# Patient Record
Sex: Male | Born: 1945 | Race: White | Hispanic: No | Marital: Married | Smoking: Never smoker
Health system: Southern US, Community
[De-identification: ages and names within clinical notes are randomized; demographics above are authoritative.]

## PROBLEM LIST (undated history)

## (undated) DIAGNOSIS — C61 Malignant neoplasm of prostate: Secondary | ICD-10-CM

## (undated) DIAGNOSIS — I10 Essential (primary) hypertension: Secondary | ICD-10-CM

## (undated) DIAGNOSIS — K219 Gastro-esophageal reflux disease without esophagitis: Secondary | ICD-10-CM

## (undated) DIAGNOSIS — M1990 Unspecified osteoarthritis, unspecified site: Secondary | ICD-10-CM

## (undated) DIAGNOSIS — M1711 Unilateral primary osteoarthritis, right knee: Secondary | ICD-10-CM

## (undated) DIAGNOSIS — I440 Atrioventricular block, first degree: Secondary | ICD-10-CM

## (undated) DIAGNOSIS — I4510 Unspecified right bundle-branch block: Secondary | ICD-10-CM

## (undated) DIAGNOSIS — K759 Inflammatory liver disease, unspecified: Secondary | ICD-10-CM

## (undated) HISTORY — DX: Essential (primary) hypertension: I10

## (undated) HISTORY — PX: COLONOSCOPY: SHX174

## (undated) HISTORY — PX: CERVICAL DISC SURGERY: SHX588

---

## 1973-09-09 DIAGNOSIS — K759 Inflammatory liver disease, unspecified: Secondary | ICD-10-CM

## 1973-09-09 HISTORY — DX: Inflammatory liver disease, unspecified: K75.9

## 2003-11-30 ENCOUNTER — Encounter: Payer: Self-pay | Admitting: Internal Medicine

## 2005-09-12 ENCOUNTER — Ambulatory Visit: Payer: Self-pay | Admitting: Internal Medicine

## 2005-09-19 ENCOUNTER — Ambulatory Visit: Payer: Self-pay | Admitting: Internal Medicine

## 2005-09-20 ENCOUNTER — Encounter: Payer: Self-pay | Admitting: Internal Medicine

## 2005-11-07 ENCOUNTER — Ambulatory Visit: Payer: Self-pay | Admitting: Internal Medicine

## 2005-11-27 ENCOUNTER — Ambulatory Visit: Payer: Self-pay | Admitting: Internal Medicine

## 2006-02-04 ENCOUNTER — Ambulatory Visit: Payer: Self-pay | Admitting: Internal Medicine

## 2006-09-19 ENCOUNTER — Ambulatory Visit: Payer: Self-pay | Admitting: Internal Medicine

## 2006-09-19 LAB — CONVERTED CEMR LAB
ALT: 28 units/L (ref 0–40)
AST: 27 units/L (ref 0–37)
Albumin: 3.8 g/dL (ref 3.5–5.2)
Alkaline Phosphatase: 55 units/L (ref 39–117)
BUN: 15 mg/dL (ref 6–23)
Basophils Absolute: 0 10*3/uL (ref 0.0–0.1)
Basophils Relative: 0.7 % (ref 0.0–1.0)
CO2: 28 meq/L (ref 19–32)
Calcium: 9.3 mg/dL (ref 8.4–10.5)
Chloride: 108 meq/L (ref 96–112)
Chol/HDL Ratio, serum: 4.9
Cholesterol: 211 mg/dL (ref 0–200)
Creatinine, Ser: 0.9 mg/dL (ref 0.4–1.5)
Direct LDL: 140.5 mg/dL
Eosinophil percent: 6.3 % — ABNORMAL HIGH (ref 0.0–5.0)
Eosinophils Relative: 6.3 % — ABNORMAL HIGH (ref 0.0–5.0)
GFR calc Af Amer: 111 mL/min
GFR calc non Af Amer: 91 mL/min
Glomerular Filtration Rate, Af Am: 111 mL/min/{1.73_m2}
Glucose, Bld: 110 mg/dL — ABNORMAL HIGH (ref 70–99)
Glucose, Bld: 110 mg/dL — ABNORMAL HIGH (ref 70–99)
HCT: 40.8 % (ref 39.0–52.0)
HDL: 43.4 mg/dL (ref >39.0)
Hemoglobin: 13.7 g/dL (ref 13.0–17.0)
Hgb A1c MFr Bld: 5.7 % (ref 4.6–6.0)
LDL DIRECT: 140.5 mg/dL
Lymphocytes Relative: 32.1 % (ref 12.0–46.0)
MCHC: 33.5 g/dL (ref 30.0–36.0)
MCV: 92.3 fL (ref 78.0–100.0)
Monocytes Absolute: 0.5 10*3/uL (ref 0.2–0.7)
Monocytes Relative: 9.2 % (ref 3.0–11.0)
Neutro Abs: 3 10*3/uL (ref 1.4–7.7)
Neutrophils Relative %: 51.7 % (ref 43.0–77.0)
PSA: 1.36 ng/mL (ref 0.10–4.00)
Platelets: 239 10*3/uL (ref 150–400)
Potassium: 4.5 meq/L (ref 3.5–5.1)
RBC: 4.42 M/uL (ref 4.22–5.81)
RDW: 12.9 % (ref 11.5–14.6)
Sodium: 141 meq/L (ref 135–145)
TSH: 2.78 microintl units/mL (ref 0.35–5.50)
Total Bilirubin: 0.9 mg/dL (ref 0.3–1.2)
Total CHOL/HDL Ratio: 4.9
Total Protein: 6.5 g/dL (ref 6.0–8.3)
Triglyceride fasting, serum: 155 mg/dL — ABNORMAL HIGH (ref 0–149)
Triglycerides: 155 mg/dL — ABNORMAL HIGH (ref 0–149)
VLDL: 31 mg/dL (ref 0–40)
WBC: 5.7 10*3/uL (ref 4.5–10.5)

## 2006-10-08 ENCOUNTER — Encounter: Payer: Self-pay | Admitting: Internal Medicine

## 2006-10-08 ENCOUNTER — Ambulatory Visit: Payer: Self-pay | Admitting: Internal Medicine

## 2006-10-08 DIAGNOSIS — E785 Hyperlipidemia, unspecified: Secondary | ICD-10-CM

## 2006-10-08 DIAGNOSIS — I10 Essential (primary) hypertension: Secondary | ICD-10-CM

## 2006-10-08 LAB — CONVERTED CEMR LAB
Cholesterol, target level: 200 mg/dL
HDL goal, serum: 40 mg/dL
LDL Goal: 130 mg/dL

## 2007-04-29 ENCOUNTER — Encounter: Payer: Self-pay | Admitting: Internal Medicine

## 2007-11-18 ENCOUNTER — Ambulatory Visit: Payer: Self-pay | Admitting: Internal Medicine

## 2007-11-18 DIAGNOSIS — I4510 Unspecified right bundle-branch block: Secondary | ICD-10-CM

## 2007-12-04 ENCOUNTER — Ambulatory Visit: Payer: Self-pay | Admitting: Internal Medicine

## 2007-12-04 LAB — CONVERTED CEMR LAB
ALT: 28 units/L (ref 0–53)
Alkaline Phosphatase: 62 units/L (ref 39–117)
Basophils Absolute: 0 10*3/uL (ref 0.0–0.1)
Bilirubin Urine: NEGATIVE
Bilirubin, Direct: 0.1 mg/dL (ref 0.0–0.3)
CO2: 28 meq/L (ref 19–32)
Calcium: 9.9 mg/dL (ref 8.4–10.5)
Cholesterol: 204 mg/dL (ref 0–200)
GFR calc Af Amer: 147 mL/min
Glucose, Bld: 103 mg/dL — ABNORMAL HIGH (ref 70–99)
Glucose, Urine, Semiquant: NEGATIVE
HDL: 44.2 mg/dL (ref >39.0)
Hemoglobin: 13.3 g/dL (ref 13.0–17.0)
Ketones, urine, test strip: NEGATIVE
Lymphocytes Relative: 29.6 % (ref 12.0–46.0)
MCHC: 33.2 g/dL (ref 30.0–36.0)
Monocytes Relative: 11.7 % (ref 3.0–12.0)
Neutro Abs: 3 10*3/uL (ref 1.4–7.7)
Nitrite: NEGATIVE
PSA: 1.8 ng/mL (ref 0.10–4.00)
Platelets: 216 10*3/uL (ref 150–400)
Potassium: 4.8 meq/L (ref 3.5–5.1)
RDW: 12.6 % (ref 11.5–14.6)
Sodium: 139 meq/L (ref 135–145)
Specific Gravity, Urine: >=1.03
TSH: 1.37 microintl units/mL (ref 0.35–5.50)
Total Bilirubin: 0.6 mg/dL (ref 0.3–1.2)
Total CHOL/HDL Ratio: 4.6
Total Protein: 6.5 g/dL (ref 6.0–8.3)
Triglycerides: 111 mg/dL (ref 0–149)
Urobilinogen, UA: 0.2
VLDL: 22 mg/dL (ref 0–40)
pH: 6

## 2007-12-11 ENCOUNTER — Ambulatory Visit: Payer: Self-pay | Admitting: Internal Medicine

## 2008-01-06 ENCOUNTER — Ambulatory Visit: Payer: Self-pay | Admitting: Gastroenterology

## 2008-01-27 ENCOUNTER — Ambulatory Visit: Payer: Self-pay | Admitting: Gastroenterology

## 2008-01-27 ENCOUNTER — Encounter: Payer: Self-pay | Admitting: Gastroenterology

## 2008-02-03 ENCOUNTER — Encounter: Payer: Self-pay | Admitting: Gastroenterology

## 2008-02-04 ENCOUNTER — Telehealth (INDEPENDENT_AMBULATORY_CARE_PROVIDER_SITE_OTHER): Payer: Self-pay | Admitting: *Deleted

## 2009-04-12 ENCOUNTER — Encounter: Admission: RE | Admit: 2009-04-12 | Discharge: 2009-04-12 | Payer: Self-pay | Admitting: Orthopaedic Surgery

## 2009-10-13 ENCOUNTER — Ambulatory Visit: Payer: Self-pay | Admitting: Internal Medicine

## 2009-10-13 LAB — CONVERTED CEMR LAB
ALT: 31 units/L (ref 0–53)
Albumin: 3.9 g/dL (ref 3.5–5.2)
Basophils Relative: 0.8 % (ref 0.0–3.0)
CO2: 28 meq/L (ref 19–32)
Chloride: 106 meq/L (ref 96–112)
Cholesterol: 201 mg/dL — ABNORMAL HIGH (ref 0–200)
Direct LDL: 131.9 mg/dL
Eosinophils Absolute: 0.2 10*3/uL (ref 0.0–0.7)
Eosinophils Relative: 3.7 % (ref 0.0–5.0)
HCT: 41.7 % (ref 39.0–52.0)
Ketones, ur: NEGATIVE mg/dL
Leukocytes, UA: NEGATIVE
Lymphs Abs: 1.7 10*3/uL (ref 0.7–4.0)
MCHC: 33.3 g/dL (ref 30.0–36.0)
MCV: 93.5 fL (ref 78.0–100.0)
Monocytes Absolute: 0.5 10*3/uL (ref 0.1–1.0)
Neutro Abs: 2.8 10*3/uL (ref 1.4–7.7)
Potassium: 4.2 meq/L (ref 3.5–5.1)
RBC: 4.46 M/uL (ref 4.22–5.81)
Specific Gravity, Urine: 1.015 (ref 1.000–1.030)
TSH: 1.96 microintl units/mL (ref 0.35–5.50)
Total CHOL/HDL Ratio: 5
Total Protein, Urine: NEGATIVE mg/dL
Total Protein: 6.7 g/dL (ref 6.0–8.3)
Urine Glucose: NEGATIVE mg/dL
WBC: 5.2 10*3/uL (ref 4.5–10.5)
pH: 6.5 (ref 5.0–8.0)

## 2009-10-26 ENCOUNTER — Ambulatory Visit: Payer: Self-pay | Admitting: Internal Medicine

## 2009-10-27 ENCOUNTER — Telehealth (INDEPENDENT_AMBULATORY_CARE_PROVIDER_SITE_OTHER): Payer: Self-pay | Admitting: *Deleted

## 2010-09-09 HISTORY — PX: OTHER SURGICAL HISTORY: SHX169

## 2010-10-09 NOTE — Progress Notes (Signed)
Summary: question  Phone Note Call from Patient   Caller: Patient Call For: Birdie Sons MD Summary of Call: has question he forgot to ask yesterday cell [phone] Initial call taken by: Raechel Ache, RN,  October 27, 2009 10:06 AM  Follow-up for Phone Call        [facility] Follow-up by: Raechel Ache, RN,  October 27, 2009 10:06 AM

## 2010-10-09 NOTE — Assessment & Plan Note (Signed)
Summary: cpx/cjr   Vital Signs:  Patient profile:   65 year old male Height:      72 inches Weight:      235 pounds BMI:     31.99 Pulse rate:   68 / minute Resp:     12 per minute BP sitting:   126 / 80  (left arm) Cuff size:   regular  Vitals Entered By: Gladis Riffle, RN (October 26, 2009 8:17 AM) CC: cpx, labs done Is Patient Diabetic? No Comments diclofenac and hydrocodone from dr Ophelia Charter   CC:  cpx and labs done.  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 Penicillin V Potassium (Penicillin V Potassium)  Past History:  Past Surgical History: ACL Thumb tendon repair (trauma) ankle repair x 2  Family History: Family History of Prostate CA 1st degree relative -father COPD-mother - sudden death sistersx2 healthy   Impression & Recommendations:  Problem # 1:  PHYSICAL EXAMINATION (ICD-V70.0) health maint UTD regular exercise discussed and encouraged  Problem # 2:  HYPERLIPIDEMIA (ICD-272.4) diet and exercise discussed  Problem # 3:  HYPERTENSION (ICD-401.9) he has been off meds for  The following medications were removed from the medication list:    Lisinopril 20 Mg Tabs (Lisinopril) .Marland Kitchen... Take 1/2 tablet by mouth once a day  BP today: 126/80 Prior BP: 110/78 (12/11/2007)  Prior 10 Yr Risk Heart Disease: Not enough information (10/08/2006)  Labs Reviewed: K+: 4.2 (10/13/2009) Creat: : 0.7 (10/13/2009)   Chol: 201 (10/13/2009)   HDL: 44.40 (10/13/2009)   LDL: DEL (12/04/2007)   TG: 183.0 (10/13/2009)  Complete Medication List: 1)  Benadryl 25 Mg Tabs (Diphenhydramine hcl) .... As needed 2)  Glucosamine-chondroitin 500-400 Mg Caps (Glucosamine-chondroitin) .... Once daily at  times 3)  Diclofenac Sodium 75 Mg Tbec (Diclofenac sodium) .... Take 1 tablet by mouth two times a day 4)  Zyrtec Allergy 10 Mg Tabs (Cetirizine hcl) .... As needed 5)  Fibertab 625 Mg Tabs (Calcium polycarbophil) .... Once daily 6)  Advil 200 Mg Tabs (Ibuprofen) .... As needed 7)  Aloe Vera Concentrate 25 Mg Caps (Aloe vera) .... Once daily 8)  Fish Oil 1000 Mg Caps (Omega-3 fatty acids) .... Once daily 9)  Hydrocodone-acetaminophen 7.5-750 Mg Tabs (Hydrocodone-acetaminophen) .... As needed   Patient Instructions: 1)  Check your Blood Pressure regularly. If it is above 140/90 consistently: you should make an appointment.  Prevention & Chronic Care Immunizations   Influenza vaccine: Not documented    Tetanus booster: 10/08/2006: given   Tetanus booster due: 10/2016    Pneumococcal vaccine: Not documented    H. zoster vaccine: Not documented  Colorectal Screening   Hemoccult: Not documented    Colonoscopy: Location:  [facility].    (01/27/2008)   Colonoscopy due: 01/2011  Other Screening   PSA: 2.34  (10/13/2009)   Smoking status: never  (10/26/2009)  Lipids   Total Cholesterol: 201  (10/13/2009)   LDL: DEL  (12/04/2007)   LDL Direct: 131.9  (10/13/2009)   HDL: 44.40  (10/13/2009)   Triglycerides: 183.0  (10/13/2009)    SGOT (AST): 31  (10/13/2009)   SGPT (ALT): 31  (10/13/2009)   Alkaline phosphatase: 70  (10/13/2009)   Total bilirubin: 0.8  (10/13/2009)  Hypertension   Last Blood Pressure: 126 / 80  (10/26/2009)   Serum creatinine: 0.7  (10/13/2009)   Serum potassium 4.2  (10/13/2009)    Hypertension flowsheet reviewed?: Yes   Progress toward BP goal: At goal  Self-Management Support :    Hypertension self-management support: Not documented    Lipid self-management support: Not documented   Physical Exam General Appearance: well developed, well nourished, no acute distress Eyes: conjunctiva and lids normal, PERRL, EOMI,  Ears, Nose,  Mouth, Throat: TM clear, nares clear, oral exam WNL Neck: supple, no lymphadenopathy, no thyromegaly, no JVD Respiratory: clear to auscultation and percussion, respiratory effort normal Cardiovascular: regular rate and rhythm, S1-S2, no murmur, rub or gallop, no bruits, peripheral pulses normal and symmetric, no cyanosis, clubbing, edema or varicosities Chest: no scars, masses, tenderness; no asymmetry, skin changes, nipple discharge, no gynecomastia   Gastrointestinal: soft, non-tender; no hepatosplenomegaly, masses; active bowel sounds all quadrants,  Genitourinary: no hernia,  or prostate enlargement Lymphatic: no cervical, axillary or inguinal adenopathy Musculoskeletal: gait normal, muscle tone and strength WNL, no joint swelling, effusions, discoloration, crepitus  Skin: clear, good turgor, color WNL, no rashes, lesions, or ulcerations Neurologic: normal mental status, normal reflexes, normal strength, sensation, and motion Psychiatric: alert; oriented to person, place and time Other Exam:

## 2011-03-27 ENCOUNTER — Encounter: Payer: Self-pay | Admitting: Gastroenterology

## 2012-05-29 ENCOUNTER — Encounter: Payer: Self-pay | Admitting: Gastroenterology

## 2012-11-26 DIAGNOSIS — C61 Malignant neoplasm of prostate: Secondary | ICD-10-CM

## 2012-11-26 HISTORY — DX: Malignant neoplasm of prostate: C61

## 2012-12-07 ENCOUNTER — Encounter: Payer: Self-pay | Admitting: Radiation Oncology

## 2012-12-07 NOTE — Progress Notes (Addendum)
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

## 2012-12-08 ENCOUNTER — Encounter: Payer: Self-pay | Admitting: Radiation Oncology

## 2012-12-08 NOTE — Progress Notes (Signed)
Radiation Oncology         (336) [phone] ________________________________  Initial outpatient Consultation  Name: Eric Dominguez MRN: [id number]  Date: 12/09/2012  DOB: 04/21/1946  GM:[id number] [name], MD  [name], MD   REFERRING PHYSICIAN: [name], MD  DIAGNOSIS: 67 y.o. 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 gentleman.  He was noted to have an elevated PSA of 4.6 by his primary care physician, Dr. Clelia Croft.  Accordingly, he was referred for evaluation in urology by Dr. Annabell Howells on 11/04/12,  digital rectal examination was performed at that time revealing a 2+ gland with a palpable nodule in the right mid gland.  The patient proceeded to transrectal ultrasound with 12 biopsies of the prostate on 3/20.  The prostate volume measured 42.52 cc.  Out of 12 core biopsies,4 were positive.  The maximum Gleason score was 3+4, and this was seen in 50% of the right medial base.  .  The patient reviewed the biopsy results with his urologist and he has kindly been referred today for discussion of potential radiation treatment options.  PREVIOUS RADIATION THERAPY: No  PAST MEDICAL HISTORY:  has a past medical history of Prostate cancer (11/26/12); Anxiety; and GERD (gastroesophageal reflux disease).    PAST SURGICAL HISTORY: Past Surgical History  Procedure Laterality Date  . Ankle surgery Left     FAMILY HISTORY: family history includes Cancer in his father.  SOCIAL HISTORY:  reports that he has never smoked. He does not have any smokeless tobacco history on file. He reports that  drinks alcohol.  ALLERGIES: Penicillins  MEDICATIONS:  Current Outpatient Prescriptions  Medication Sig Dispense Refill  . aspirin 81 MG tablet Take 81 mg by mouth daily.      Marland Kitchen ibuprofen (ADVIL,MOTRIN) 100 MG chewable tablet Chew 100 mg by mouth every 8 (eight) hours as needed for fever.        No current facility-administered medications for this encounter.    REVIEW OF SYSTEMS:  A 15 point review of systems is documented in the electronic medical record. This was obtained by the nursing staff. However, I reviewed this with the patient to discuss relevant findings and make appropriate changes.  A comprehensive review of systems was negative..  The patient completed an IPSS and IIEF questionnaire.  His IPSS score was 3 indicating mild urinary outflow obstructive symptoms.  He indicated that his erectile function is able to complete sexual activity on most attempts, sometimes relying on Viagra.   PHYSICAL EXAM: This patient is in no acute distress.  He is alert and oriented.   height is 6\' 1"  (1.854 m) and weight is 229 lb 8 oz (104.101 kg). His oral temperature is 98.2 F (36.8 C). His blood pressure is 167/92 and his pulse is 66. His respiration is 20 and oxygen saturation is 100%.  He exhibits no respiratory distress or labored breathing.  He appears neurologically intact.  His mood is pleasant.  His affect is appropriate.  Please note the digital rectal exam findings described above.  LABORATORY DATA:  Lab Results  Component Value Date   WBC 5.2 10/13/2009   HGB 13.9 10/13/2009   HCT 41.7 10/13/2009   MCV 93.5 10/13/2009   PLT 220.0 10/13/2009   Lab Results  Component Value Date   NA 138 10/13/2009   K 4.2 10/13/2009   CL 106 10/13/2009  CO2 28 10/13/2009   Lab Results  Component Value Date   ALT 31 10/13/2009   AST 31 10/13/2009   ALKPHOS 70 10/13/2009   BILITOT 0.8 10/13/2009     RADIOGRAPHY: No results found.    IMPRESSION: This gentleman is a 67 y.o. 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  His T-Stage, Gleason's Score, and PSA put him into the intermediate risk group with low volume disease and a primary Gleason grade of 3.  Accordingly he is eligible for a variety of potential treatment options including prostate  brachytherapy as monotherapy.  PLAN:Today I reviewed the findings and workup thus far.  We discussed the natural history of prostate cancer.  We reviewed the the implications of T-stage, Gleason's Score, and PSA on decision-making and outcomes in prostate cancer.  We discussed radiation treatment in the management of prostate cancer with regard to the logistics and delivery of external beam radiation treatment as well as the logistics and delivery of prostate brachytherapy.  We compared and contrasted each of these approaches and also compared these against prostatectomy.  The patient expressed interest in prostate brachytherapy.  I filled out a patient counseling form for him with relevant treatment diagrams and we retained a copy for our records.   The patient would like to proceed with prostate brachytherapy.  I will share my findings with Dr. Annabell Howells and move forward with scheduling the procedure in the near future.     I enjoyed meeting with him today, and will look forward to participating in the care of this very nice gentleman.   I spent 60 minutes face to face with the patient and more than 50% of that time was spent in counseling and/or coordination of care.   ------------------------------------------------  Artist Pais. Kathrynn Running, M.D.

## 2012-12-09 ENCOUNTER — Ambulatory Visit
Admission: RE | Admit: 2012-12-09 | Discharge: 2012-12-09 | Disposition: A | Discharge status: Home / Self Care | Payer: BC Managed Care – PPO | Source: Ambulatory Visit | Attending: Radiation Oncology | Admitting: Radiation Oncology

## 2012-12-09 ENCOUNTER — Encounter: Payer: Self-pay | Admitting: Radiation Oncology

## 2012-12-09 VITALS — BP 167/92 | HR 66 | Temp 98.2°F | Resp 20 | Ht 73.0 in | Wt 229.5 lb

## 2012-12-09 DIAGNOSIS — C61 Malignant neoplasm of prostate: Secondary | ICD-10-CM | POA: Insufficient documentation

## 2012-12-09 HISTORY — DX: Malignant neoplasm of prostate: C61

## 2012-12-09 HISTORY — DX: Gastro-esophageal reflux disease without esophagitis: K21.9

## 2012-12-09 NOTE — Progress Notes (Signed)
  Radiation Oncology         (336) [phone] ________________________________  Name: Scottie Metayer MRN: [id number]  Date: 12/09/2012  DOB: 01-28-1946  SIMULATION AND TREATMENT PLANNING NOTE PUBIC ARCH STUDY  WU:[id number] [name], MD  [name], MD  DIAGNOSIS: 66 year old gentlemen with stage T2a adenocarcinoma of the prostate with a Gleason of 3+4 and a PSA of 4.6.  COMPLEX SIMULATION:  The patient presented today for evaluation for possible prostate seed implant. He was brought to the radiation planning suite and placed supine on the CT couch. A 3-dimensional image study set was obtained in upload to the planning computer. There, on each axial slice, I contoured the prostate gland. Then, using three-dimensional radiation planning tools I reconstructed the prostate in view of the structures from the transperineal needle pathway to assess for possible pubic arch interference. In doing so, I did not appreciate any pubic arch interference. Also, the patient's prostate volume was estimated based on the drawn structure. The volume was 51 cc.  Given the pubic arch appearance and prostate volume, patient remains a good candidate to proceed with prostate seed implant. Today, he freely provided informed written consent to proceed.    PLAN: The patient will undergo prostate seed implant.   ________________________________  Artist Pais. Kathrynn Running, M.D.

## 2012-12-09 NOTE — Progress Notes (Signed)
Please see the Nurse Progress Note in the MD Initial Consult Encounter for this patient. 

## 2012-12-10 ENCOUNTER — Encounter (HOSPITAL_BASED_OUTPATIENT_CLINIC_OR_DEPARTMENT_OTHER)
Admission: RE | Admit: 2012-12-10 | Discharge: 2012-12-10 | Disposition: A | Discharge status: Home / Self Care | Payer: BC Managed Care – PPO | Source: Ambulatory Visit | Attending: Urology | Admitting: Urology

## 2012-12-10 ENCOUNTER — Ambulatory Visit (HOSPITAL_BASED_OUTPATIENT_CLINIC_OR_DEPARTMENT_OTHER)
Admission: RE | Admit: 2012-12-10 | Discharge: 2012-12-10 | Disposition: A | Discharge status: Home / Self Care | Payer: BC Managed Care – PPO | Source: Ambulatory Visit | Attending: Urology | Admitting: Urology

## 2012-12-10 ENCOUNTER — Telehealth: Payer: Self-pay | Admitting: *Deleted

## 2012-12-10 ENCOUNTER — Other Ambulatory Visit: Payer: Self-pay | Admitting: Urology

## 2012-12-10 DIAGNOSIS — C61 Malignant neoplasm of prostate: Secondary | ICD-10-CM | POA: Insufficient documentation

## 2012-12-10 DIAGNOSIS — Z01810 Encounter for preprocedural cardiovascular examination: Secondary | ICD-10-CM | POA: Insufficient documentation

## 2012-12-10 DIAGNOSIS — I771 Stricture of artery: Secondary | ICD-10-CM | POA: Insufficient documentation

## 2012-12-10 DIAGNOSIS — Z01818 Encounter for other preprocedural examination: Secondary | ICD-10-CM | POA: Insufficient documentation

## 2012-12-10 NOTE — Telephone Encounter (Signed)
CALLED PATIENT TO INFORM OF IMPLANT DATE, LVM FOR A RETURN CALL 

## 2012-12-11 ENCOUNTER — Telehealth: Payer: Self-pay | Admitting: Dietician

## 2012-12-16 ENCOUNTER — Ambulatory Visit: Payer: Self-pay

## 2012-12-16 ENCOUNTER — Ambulatory Visit: Payer: Self-pay | Admitting: Radiation Oncology

## 2013-01-06 ENCOUNTER — Telehealth: Payer: Self-pay | Admitting: *Deleted

## 2013-01-06 NOTE — Telephone Encounter (Signed)
CALLED PATIENT TO REMIND OF APPT. FOR 01-08-13, SPOKE WITH PATIENT AND HE IS AWARE OF THIS APPT.

## 2013-01-08 LAB — COMPREHENSIVE METABOLIC PANEL
ALT: 19 U/L (ref 0–53)
AST: 24 U/L (ref 0–37)
Albumin: 3.9 g/dL (ref 3.5–5.2)
Alkaline Phosphatase: 71 U/L (ref 39–117)
BUN: 18 mg/dL (ref 6–23)
CO2: 25 mEq/L (ref 19–32)
Calcium: 8.9 mg/dL (ref 8.4–10.5)
Chloride: 105 mEq/L (ref 96–112)
Creatinine, Ser: 0.77 mg/dL (ref 0.50–1.35)
GFR calc Af Amer: >90 mL/min (ref >90)
GFR calc non Af Amer: >90 mL/min (ref >90)
Glucose, Bld: 115 mg/dL — ABNORMAL HIGH (ref 70–99)
Potassium: 4.3 mEq/L (ref 3.5–5.1)
Sodium: 138 mEq/L (ref 135–145)
Total Bilirubin: 0.5 mg/dL (ref 0.3–1.2)
Total Protein: 6.9 g/dL (ref 6.0–8.3)

## 2013-01-08 LAB — CBC
HCT: 40.7 % (ref 39.0–52.0)
Hemoglobin: 13.6 g/dL (ref 13.0–17.0)
MCH: 30.3 pg (ref 26.0–34.0)
MCHC: 33.4 g/dL (ref 30.0–36.0)
MCV: 90.6 fL (ref 78.0–100.0)
Platelets: 182 10*3/uL (ref 150–400)
RBC: 4.49 MIL/uL (ref 4.22–5.81)
RDW: 13.3 % (ref 11.5–15.5)
WBC: 7 10*3/uL (ref 4.0–10.5)

## 2013-01-08 LAB — APTT: aPTT: 28 seconds (ref 24–37)

## 2013-01-12 ENCOUNTER — Encounter (HOSPITAL_BASED_OUTPATIENT_CLINIC_OR_DEPARTMENT_OTHER): Payer: Self-pay | Admitting: *Deleted

## 2013-01-12 NOTE — Progress Notes (Signed)
NPO AFTER MN. ARRIVES AT 0815. CURRENT LAB RESULTS, CXR AND EKG IN EPIC AND CHART. WILL TAKE ZANTAC AM OF SURG W/ SIP OF WATER AND DO FLEET ENEMA.

## 2013-01-13 ENCOUNTER — Telehealth: Payer: Self-pay | Admitting: *Deleted

## 2013-01-13 NOTE — H&P (Signed)
istory of Present Illness  Eric Dominguez is a 67 yo WM sent in consultation by Dr. Clelia Croft for an elevated PSA of 4.6.  I have pulled up his labs from [facility] and he had PSA's in 2008, 2009 and 2011 that were 1.36, 1.8 and 2.34.   He has minimal voiding symptoms with just occasional nocturia.   He has a history of mild ED and uses Viagra on occasion.  He has no other GU history.  His father had prostate cancer that was diagnosed in his 59's and had a seed implant.  His father is now 94 and has recurrent disease.   Past Medical History Problems  1. History of  Anxiety (Symptom) 300.00 2. History of  Esophageal Reflux 530.81  Surgical History Problems  1. History of  Ankle Surgery Left  Current Meds 1. Advil TABS; Therapy: (Recorded:26Feb2014) to 2. Aspirin TABS; Therapy: (Recorded:26Feb2014) to  Allergies Medication  1. Penicillins  Family History Problems  1. Family history of  Death In The Family Mother 2. Family history of  Family Health Status Number Of Children 3. Family history of  Family Health Status Of Father - Alive 4. Paternal history of  Prostate Cancer V16.42  Social History Problems    Alcohol Use   Caffeine Use   Marital History - Currently Married   Occupation:  Review of Systems Genitourinary, constitutional, skin, eye, otolaryngeal, hematologic/lymphatic, cardiovascular, pulmonary, endocrine, musculoskeletal, gastrointestinal, neurological and psychiatric system(s) were reviewed and pertinent findings if present are noted.  Genitourinary: nocturia.    Vitals Vital Signs [Data Includes: Last 1 Day]  26Feb2014 02:45PM  BMI Calculated: 29.16 BSA Calculated: 2.24 Height: 6 ft 1 in Weight: 220 lb  Blood Pressure: 157 / 90 Temperature: 98.3 F Heart Rate: 74  Physical Exam Constitutional: Well nourished and well developed . No acute distress.  ENT:. The ears and nose are normal in appearance.  Neck: The appearance of the neck is normal and no neck mass is present.   Pulmonary: No respiratory distress and normal respiratory rhythm and effort.  Cardiovascular: Heart rate and rhythm are normal . No peripheral edema.  Abdomen: The abdomen is soft and nontender. No masses are palpated. No CVA tenderness. No hernias are palpable. No hepatosplenomegaly noted.  Rectal: Rectal exam demonstrates normal sphincter tone, no tenderness and no masses. Estimated prostate size is 2+. The prostate has a palpable nodule involving the right, mid aspect of the prostate and is not tender. The left seminal vesicle is nonpalpable. The right seminal vesicle is nonpalpable. The perineum is normal on inspection.  Genitourinary: Examination of the penis demonstrates no discharge, no masses, no lesions and a normal meatus. The scrotum is without lesions. The right epididymis is palpably normal and non-tender. The left epididymis is palpably normal and non-tender. The right testis is non-tender and without masses. The left testis is non-tender and without masses.  Lymphatics: The supraclavicular, femoral and inguinal nodes are not enlarged or tender.  Skin: Normal skin turgor, no visible rash and no visible skin lesions.  Neuro/Psych:. Mood and affect are appropriate. No motor or sensory deficits.    Results/Data Urine [Data Includes: Last 1 Day]   26Feb2014  COLOR YELLOW   APPEARANCE CLEAR   SPECIFIC GRAVITY 1.025   pH 6.0   GLUCOSE NEG mg/dL  BILIRUBIN NEG   KETONE NEG mg/dL  BLOOD NEG   PROTEIN NEG mg/dL  UROBILINOGEN 0.2 mg/dL  NITRITE NEG   LEUKOCYTE ESTERASE NEG    Old records or history  reviewed: I have reviewed the records and labs from Dr. Clelia Croft and obtained his PSA's from [facility].    Assessment Assessed  1. Prostate Hard Area Or Nodule 600.10 2. PSA,Elevated 790.93   He has an elevated PSA with a right prostate nodule and a family history of prostate cancer.  Plan Prostate Cancer (185)  1. Radiation Oncology Referral Referral  Referral  Requested for: 26Mar2014    He doesn't need staging studies.  I discussed the options for therapy including active surveillance, cryo, HIFU, radical prostatectomy, EXRT and brachytherapy. He has reviewed my prostate cancer handout that details the risks of the treatments discussed.   I don't believe he is a good candidate for AS with his intermediate risk disease, PSA kinetics and general good health but explained that without treatment it still could be several years before he had problems from prostate cancer.  He is a good candidate for surgical therapy, EXRT and Brachytherapy, but after a thorough review of those options he is most interested in brachytherapy.   His father had brachytherapy 20 years ago and is doing well.  He has a very active life and would like to avoid downtime as much as possible.  At this time there is no conclusive data to support that surgery, EXRT or brachytherapy is superior to each other as far as prostate outcomes are concerned.  The brachytherapy should have the lowest risk of erectile dysfunction or incontinence, but I did reinforce that about 20% men can have some prolonged voiding issues.   I will get him set up to see Dr. Margaretmary Bayley who also treats [name]'s wife.    He has a T1c Nx Mx Gleason 6 prostate cancer and has elected seeds.

## 2013-01-13 NOTE — Telephone Encounter (Signed)
CALLED PATIENT TO INFORM OF IMPLANT FOR 01-14-13, SPOKE WITH PATIENT AND HE IS AWARE OF THIS PROCEDURE.

## 2013-01-14 ENCOUNTER — Ambulatory Visit (HOSPITAL_BASED_OUTPATIENT_CLINIC_OR_DEPARTMENT_OTHER): Payer: BC Managed Care – PPO | Admitting: Anesthesiology

## 2013-01-14 ENCOUNTER — Ambulatory Visit (HOSPITAL_COMMUNITY): Payer: BC Managed Care – PPO

## 2013-01-14 ENCOUNTER — Encounter (HOSPITAL_BASED_OUTPATIENT_CLINIC_OR_DEPARTMENT_OTHER)
Admission: RE | Disposition: A | Discharge status: Home / Self Care | Payer: Self-pay | Source: Ambulatory Visit | Attending: Urology

## 2013-01-14 ENCOUNTER — Encounter (HOSPITAL_BASED_OUTPATIENT_CLINIC_OR_DEPARTMENT_OTHER): Payer: Self-pay | Admitting: Anesthesiology

## 2013-01-14 ENCOUNTER — Ambulatory Visit (HOSPITAL_BASED_OUTPATIENT_CLINIC_OR_DEPARTMENT_OTHER)
Admission: RE | Admit: 2013-01-14 | Discharge: 2013-01-14 | Disposition: A | Discharge status: Home / Self Care | Payer: BC Managed Care – PPO | Source: Ambulatory Visit | Attending: Urology | Admitting: Urology

## 2013-01-14 ENCOUNTER — Encounter (HOSPITAL_BASED_OUTPATIENT_CLINIC_OR_DEPARTMENT_OTHER): Payer: Self-pay | Admitting: *Deleted

## 2013-01-14 DIAGNOSIS — N402 Nodular prostate without lower urinary tract symptoms: Secondary | ICD-10-CM | POA: Insufficient documentation

## 2013-01-14 DIAGNOSIS — R972 Elevated prostate specific antigen [PSA]: Secondary | ICD-10-CM | POA: Insufficient documentation

## 2013-01-14 DIAGNOSIS — C61 Malignant neoplasm of prostate: Secondary | ICD-10-CM

## 2013-01-14 DIAGNOSIS — Z8042 Family history of malignant neoplasm of prostate: Secondary | ICD-10-CM | POA: Insufficient documentation

## 2013-01-14 DIAGNOSIS — K219 Gastro-esophageal reflux disease without esophagitis: Secondary | ICD-10-CM | POA: Insufficient documentation

## 2013-01-14 HISTORY — DX: Atrioventricular block, first degree: I44.0

## 2013-01-14 HISTORY — DX: Unspecified right bundle-branch block: I45.10

## 2013-01-14 HISTORY — PX: CYSTOSCOPY: SHX5120

## 2013-01-14 HISTORY — PX: RADIOACTIVE SEED IMPLANT: SHX5150

## 2013-01-14 SURGERY — INSERTION, RADIATION SOURCE, PROSTATE
Anesthesia: General | Site: Prostate | Wound class: Clean Contaminated

## 2013-01-14 MED ORDER — IOHEXOL 350 MG/ML SOLN
INTRAVENOUS | Status: DC | PRN
  Administered 2013-01-14: 7 mL

## 2013-01-14 MED ORDER — FLEET ENEMA 7-19 GM/118ML RE ENEM
1.0000 | ENEMA | Freq: Once | RECTAL | Status: DC
  Filled 2013-01-14: qty 1

## 2013-01-14 MED ORDER — ONDANSETRON HCL 4 MG/2ML IJ SOLN
INTRAMUSCULAR | Status: DC | PRN
  Administered 2013-01-14: 4 mg via INTRAVENOUS

## 2013-01-14 MED ORDER — DEXAMETHASONE SODIUM PHOSPHATE 4 MG/ML IJ SOLN
INTRAMUSCULAR | Status: DC | PRN
  Administered 2013-01-14: 8 mg via INTRAVENOUS

## 2013-01-14 MED ORDER — TAMSULOSIN HCL 0.4 MG PO CAPS: 0.4000 mg | ORAL_CAPSULE | Freq: Every day | ORAL | Status: DC

## 2013-01-14 MED ORDER — FENTANYL CITRATE 0.05 MG/ML IJ SOLN
25.0000 ug | INTRAMUSCULAR | Status: DC | PRN
  Filled 2013-01-14: qty 1

## 2013-01-14 MED ORDER — HYDROCODONE-ACETAMINOPHEN 5-325 MG PO TABS: 1.0000 | ORAL_TABLET | Freq: Four times a day (QID) | ORAL | Status: DC | PRN

## 2013-01-14 MED ORDER — CIPROFLOXACIN HCL 500 MG PO TABS: 500.0000 mg | ORAL_TABLET | Freq: Two times a day (BID) | ORAL | Status: DC

## 2013-01-14 MED ORDER — MIDAZOLAM HCL 5 MG/5ML IJ SOLN
INTRAMUSCULAR | Status: DC | PRN
  Administered 2013-01-14: 2 mg via INTRAVENOUS

## 2013-01-14 MED ORDER — LACTATED RINGERS IV SOLN
INTRAVENOUS | Status: DC
  Administered 2013-01-14 (×2): via INTRAVENOUS
  Filled 2013-01-14: qty 1000

## 2013-01-14 MED ORDER — FENTANYL CITRATE 0.05 MG/ML IJ SOLN
INTRAMUSCULAR | Status: DC | PRN
  Administered 2013-01-14 (×3): 25 ug via INTRAVENOUS
  Administered 2013-01-14: 50 ug via INTRAVENOUS
  Administered 2013-01-14 (×3): 25 ug via INTRAVENOUS

## 2013-01-14 MED ORDER — CIPROFLOXACIN IN D5W 400 MG/200ML IV SOLN
400.0000 mg | INTRAVENOUS | Status: AC
  Administered 2013-01-14: 400 mg via INTRAVENOUS
  Filled 2013-01-14: qty 200

## 2013-01-14 MED ORDER — PROPOFOL 10 MG/ML IV BOLUS
INTRAVENOUS | Status: DC | PRN
  Administered 2013-01-14: 200 mg via INTRAVENOUS

## 2013-01-14 MED ORDER — LACTATED RINGERS IV SOLN
INTRAVENOUS | Status: DC
  Filled 2013-01-14: qty 1000

## 2013-01-14 MED ORDER — STERILE WATER FOR IRRIGATION IR SOLN
Status: DC | PRN
  Administered 2013-01-14: 3000 mL

## 2013-01-14 MED ORDER — LIDOCAINE HCL (CARDIAC) 20 MG/ML IV SOLN
INTRAVENOUS | Status: DC | PRN
  Administered 2013-01-14: 80 mg via INTRAVENOUS

## 2013-01-14 SURGICAL SUPPLY — 26 items
BAG URINE DRAINAGE (UROLOGICAL SUPPLIES) ×3 IMPLANT
BLADE SURG ROTATE 9660 (MISCELLANEOUS) ×3 IMPLANT
CATH FOLEY 2WAY SLVR  5CC 16FR (CATHETERS) ×2
CATH FOLEY 2WAY SLVR 5CC 16FR (CATHETERS) ×4 IMPLANT
CATH ROBINSON RED A/P 20FR (CATHETERS) ×3 IMPLANT
CLOTH BEACON ORANGE TIMEOUT ST (SAFETY) ×3 IMPLANT
COVER MAYO STAND STRL (DRAPES) ×3 IMPLANT
COVER TABLE BACK 60X90 (DRAPES) ×3 IMPLANT
DRSG TEGADERM 4X4.75 (GAUZE/BANDAGES/DRESSINGS) ×3 IMPLANT
DRSG TEGADERM 8X12 (GAUZE/BANDAGES/DRESSINGS) ×3 IMPLANT
GLOVE BIO SURGEON STRL SZ 6.5 (GLOVE) ×1 IMPLANT
GLOVE BIO SURGEON STRL SZ7.5 (GLOVE) ×9 IMPLANT
GLOVE BIOGEL PI IND STRL 7.0 (GLOVE) IMPLANT
GLOVE BIOGEL PI INDICATOR 7.0 (GLOVE) ×1
GLOVE ECLIPSE 8.0 STRL XLNG CF (GLOVE) ×5 IMPLANT
GLOVE SURG SS PI 8.0 STRL IVOR (GLOVE) ×6 IMPLANT
GOWN PREVENTION PLUS LG XLONG (DISPOSABLE) ×3 IMPLANT
GOWN STRL REIN XL XLG (GOWN DISPOSABLE) ×3 IMPLANT
HOLDER FOLEY CATH W/STRAP (MISCELLANEOUS) ×3 IMPLANT
PACK CYSTOSCOPY (CUSTOM PROCEDURE TRAY) ×3 IMPLANT
SPONGE GAUZE 4X4 12PLY (GAUZE/BANDAGES/DRESSINGS) ×1 IMPLANT
SYRINGE 10CC LL (SYRINGE) ×3 IMPLANT
UNDERPAD 30X30 INCONTINENT (UNDERPADS AND DIAPERS) ×6 IMPLANT
WATER STERILE IRR 3000ML UROMA (IV SOLUTION) ×3 IMPLANT
WATER STERILE IRR 500ML POUR (IV SOLUTION) ×3 IMPLANT
nucletron selectseed ×1 IMPLANT

## 2013-01-14 NOTE — Brief Op Note (Signed)
01/14/2013  11:03 AM  PATIENT:  Eric Dominguez  67 y.o. male  PRE-OPERATIVE DIAGNOSIS:  prostate cancer T1c  POST-OPERATIVE DIAGNOSIS:  prostate cancer T1c  PROCEDURE:  Procedure(s) with comments: RADIOACTIVE SEED IMPLANT (N/A) - 68    seeds implanted CYSTOSCOPY FLEXIBLE (N/A) - no seeds found in bladder  SURGEON:  Surgeon(s) and Role:    * [name], MD - Primary    * Oneita Hurt, MD - Assisting  PHYSICIAN ASSISTANT:   ASSISTANTS: none   ANESTHESIA:   general  EBL:  Total I/O In: 1000 [I.V.:1000] Out: -   BLOOD ADMINISTERED:none  DRAINS: Urinary Catheter (Foley)   LOCAL MEDICATIONS USED:  NONE  SPECIMEN:  No Specimen  DISPOSITION OF SPECIMEN:  N/A  COUNTS:  YES  TOURNIQUET:  * No tourniquets in log *  DICTATION: .Other Dictation: Dictation Number [id number]  PLAN OF CARE: Discharge to home after PACU  PATIENT DISPOSITION:  PACU - hemodynamically stable.   Delay start of Pharmacological VTE agent (>24hrs) due to surgical blood loss or risk of bleeding: not applicable

## 2013-01-14 NOTE — Interval H&P Note (Signed)
History and Physical Interval Note:  01/14/2013 8:51 AM  Eric Dominguez  has presented today for surgery, with the diagnosis of prostate cancer  The various methods of treatment have been discussed with the patient and family. After consideration of risks, benefits and other options for treatment, the patient has consented to  Procedure(s) with comments: RADIOACTIVE SEED IMPLANT (N/A) -     seeds implanted CYSTOSCOPY FLEXIBLE (N/A) - no seeds found in bladder as a surgical intervention .  The patient's history has been reviewed, patient examined, no change in status, stable for surgery.  I have reviewed the patient's chart and labs.  Questions were answered to the patient's satisfaction.     Eric Dominguez J

## 2013-01-14 NOTE — Anesthesia Postprocedure Evaluation (Signed)
  Anesthesia Post-op Note  Patient: Eric Dominguez  Procedure(s) Performed: Procedure(s) (LRB): RADIOACTIVE SEED IMPLANT (N/A) CYSTOSCOPY FLEXIBLE (N/A)  Patient Location: PACU  Anesthesia Type: General  Level of Consciousness: awake and alert   Airway and Oxygen Therapy: Patient Spontanous Breathing  Post-op Pain: mild  Post-op Assessment: Post-op Vital signs reviewed, Patient's Cardiovascular Status Stable, Respiratory Function Stable, Patent Airway and No signs of Nausea or vomiting  Last Vitals:  Filed Vitals:   01/14/13 1109  BP:   Pulse:   Temp: 36.6 C  Resp: 10    Post-op Vital Signs: stable   Complications: No apparent anesthesia complications

## 2013-01-14 NOTE — Anesthesia Procedure Notes (Signed)
Procedure Name: LMA Insertion Date/Time: 01/14/2013 9:44 AM Performed by: Renella Cunas D Pre-anesthesia Checklist: Patient identified, Emergency Drugs available, Suction available and Patient being monitored Patient Re-evaluated:Patient Re-evaluated prior to inductionOxygen Delivery Method: Circle System Utilized Preoxygenation: Pre-oxygenation with 100% oxygen Intubation Type: IV induction Ventilation: Mask ventilation without difficulty LMA: LMA inserted LMA Size: 5.0 Number of attempts: 1 Airway Equipment and Method: bite block Placement Confirmation: positive ETCO2 Tube secured with: Tape Dental Injury: Teeth and Oropharynx as per pre-operative assessment

## 2013-01-14 NOTE — Transfer of Care (Signed)
Immediate Anesthesia Transfer of Care Note  Patient: Eric Dominguez  Procedure(s) Performed: Procedure(s) (LRB): RADIOACTIVE SEED IMPLANT (N/A) CYSTOSCOPY FLEXIBLE (N/A)  Patient Location: PACU  Anesthesia Type: General  Level of Consciousness: awake, oriented, sedated and patient cooperative  Airway & Oxygen Therapy: Patient Spontanous Breathing and Patient connected to face mask oxygen  Post-op Assessment: Report given to PACU RN and Post -op Vital signs reviewed and stable  Post vital signs: Reviewed and stable  Complications: No apparent anesthesia complications

## 2013-01-14 NOTE — Anesthesia Preprocedure Evaluation (Addendum)
Anesthesia Evaluation  Patient identified by MRN, date of birth, ID band Patient awake    Reviewed: Allergy & Precautions, H&P , NPO status , Patient's Chart, lab work & pertinent test results, reviewed documented beta blocker date and time   Airway Mallampati: II TM Distance: >3 FB Neck ROM: full    Dental  (+) Caps and Dental Advisory Given 2 upper front capped:   Pulmonary neg pulmonary ROS,  breath sounds clear to auscultation  Pulmonary exam normal       Cardiovascular Exercise Tolerance: Good hypertension, Rhythm:regular Rate:Normal  RBBB. LAFB   Neuro/Psych negative neurological ROS  negative psych ROS   GI/Hepatic negative GI ROS, Neg liver ROS, GERD-  Medicated and Controlled,  Endo/Other  negative endocrine ROS  Renal/GU negative Renal ROS  negative genitourinary   Musculoskeletal   Abdominal   Peds  Hematology negative hematology ROS (+)   Anesthesia Other Findings   Reproductive/Obstetrics negative OB ROS                          Anesthesia Physical Anesthesia Plan  ASA: III  Anesthesia Plan: General   Post-op Pain Management:    Induction: Intravenous  Airway Management Planned: LMA  Additional Equipment:   Intra-op Plan:   Post-operative Plan:   Informed Consent: I have reviewed the patients History and Physical, chart, labs and discussed the procedure including the risks, benefits and alternatives for the proposed anesthesia with the patient or authorized representative who has indicated his/her understanding and acceptance.   Dental Advisory Given  Plan Discussed with: CRNA and Surgeon  Anesthesia Plan Comments:         Anesthesia Quick Evaluation

## 2013-01-15 ENCOUNTER — Encounter (HOSPITAL_BASED_OUTPATIENT_CLINIC_OR_DEPARTMENT_OTHER): Payer: Self-pay | Admitting: Urology

## 2013-01-15 NOTE — Op Note (Signed)
NAME:  Eric Dominguez, Eric Dominguez NO.:  [id number]  MEDICAL RECORD NO.:  [id number]  LOCATION:                                FACILITY:  [facility]  PHYSICIAN:  Eric Dominguez, M.D.    DATE OF BIRTH:  [birth year]  DATE OF PROCEDURE: DATE OF DISCHARGE:                              OPERATIVE REPORT   PROCEDURE:  Prostate seed implantation and cystoscopy.  PREOPERATIVE DIAGNOSIS:  T2A Gleason 7, 3 + 4 prostate cancer.  POSTOPERATIVE DIAGNOSIS:  T2A Gleason 7, 3 + 4 prostate cancer.  SURGEON:  Eric Dominguez, M.D., radiation oncologist Eric Dominguez, M.D.  ANESTHESIA:  General.  DRAINS:  Foley.  BLOOD LOSS:  None.  COMPLICATIONS:  None.  INDICATIONS:  Eric Dominguez is a 66 year old, white male, who was referred for an elevated PSA 4.65 and a nodule on the right.  His PSA had been rising over the past several years with a doubling time of 2-3 years. He was found to have a core at the right base.  An area of a hypoechoic lesion with 50% Gleason 7, 3 + 4.  He also had Gleason 6 disease in 90% of the right apical core 15% in the right medial apical core and 25% to the right mid medial core.  After reviewing the options, he elected brachy therapy for treatment of his cancer.  FINDINGS OF PROCEDURE:  He was taken to the operating room, where he was given general anesthetic and 400 mg of Cipro IV.  He was fitted with PAS hose and placed in lithotomy position.  His genitalia was prepped with Betadine solution and a 16-French Foley catheter was inserted.  The balloon was filled with contrast and the catheter was placed to straight drainage.  His genitalia was then reflected superiorly with an OpSite. His perineum was clipped.  A red rubber rectal catheter was inserted. The ultrasound probe was assembled and inserted and secured to the fixed base.  The perineum was prepped.  He was draped and a sterile grid was placed.  Stabilization needles were placed to secure the prostate.  At  this point, Dr. Kathrynn Dominguez and radiation physicist performed the intraoperative prostate contouring and the treatment plan.  Once the treatment plan had been completed on the nuclear trying device, I was called back to the room where a total of 68 seeds were placed using 16 activated needles with iodine-125 select seed used for reference dose of 145 Gy.  Once all the seeds had been placed, the stabilization needles were removed followed by the ultrasound probe, fluoroscopy, and imaging was performed to assess seed placement.  The genitals were then draped.  The Foley catheter was removed and the penis was re-prepped with Betadine.  Cystoscopy was performed using a 16- [place] flexible scope.  Examination revealed a normal urethra and intact external sphincter.  The prostatic urethra was approximately 3 cm in length with bilobar hyperplasia with minimal obstruction.  Examination of bladder revealed mild trabeculation.  No tumors, stones, or inflammation were noted.  No seeds were noted.  Ureteral orifices were unremarkable.  After completion of cystoscopy, a fresh 16-French Foley catheter was placed.  The perineum  was cleansed after Miller red rubber rectal catheter and a dressing was applied.  The patient was taken down from lithotomy position.  His anesthetic was reversed.  He was moved to recovery in stable condition.  There were no complications.     Eric Dominguez, M.D.     JJW/MEDQ  D:  01/14/2013  T:  01/15/2013  Job:  [id number]  cc:   Eric Dominguez, M.D. Fax: [phone]

## 2013-01-17 NOTE — Procedures (Signed)
  Radiation Oncology         [phone] ________________________________  Name: Eric Dominguez MRN: [id number]  Date: 12/10/2012  DOB: 12/10/1945       Prostate Seed Implant  WU:[id number],YNWGN [name], MD  No ref. 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  RADIATION DOSE: 145 Gy, definitive therapy.  TECHNIQUE: Eric Dominguez was brought to the operating room with the urologist. He was placed in the dorsolithotomy position. He was catheterized and a rectal tube was inserted. The perineum was shaved, prepped and draped. The ultrasound probe was then introduced into the rectum to see the prostate gland.  TREATMENT DEVICE: A needle grid was attached to the ultrasound probe stand and anchor needles were placed.  COMPLEX ISODOSE CALCULATION: The prostate was imaged in 3D using a sagittal sweep of the prostate probe. These images were transferred to the planning computer. There, the prostate, urethra and rectum were defined on each axial reconstructed image. Then, the software created an optimized plan and a few seed positions were adjusted. Then the accepted plan was uploaded to the seed Selectron afterloading unit.  SPECIAL TREATMENT PROCEDURE/SUPERVISION AND HANDLING: The Nucletron FIRST system was used to place the needles under sagittal guidance. A total of 16 needles were used to deposit 68 seeds in the prostate gland. The individual seed activity was 0.545 mCi for a total implant activity of 37.06 mCi.  COMPLEX SIMULATION: At the end of the procedure, an anterior radiograph of the pelvis was obtained to document seed positioning and count. Cystoscopy was performed to check the urethra and bladder.  MICRODOSIMETRY: At the end of the procedure, the patient was emitting 0.09 mrem/hr at 1 meter. Accordingly, he was considered safe for  hospital discharge.  PLAN: The patient will return to the radiation oncology clinic for post implant CT dosimetry in three weeks.   ________________________________  Artist Pais Kathrynn Running, M.D.

## 2013-01-18 ENCOUNTER — Telehealth: Payer: Self-pay | Admitting: *Deleted

## 2013-01-18 NOTE — Telephone Encounter (Signed)
Returned patient's phone call, lvm for a return call 

## 2013-02-03 ENCOUNTER — Telehealth: Payer: Self-pay | Admitting: *Deleted

## 2013-02-03 NOTE — Telephone Encounter (Signed)
Called patient to remind of appts. For 02-04-13, lvm for a return call

## 2013-02-04 ENCOUNTER — Ambulatory Visit: Payer: BC Managed Care – PPO | Admitting: Radiation Oncology

## 2013-02-17 ENCOUNTER — Telehealth: Payer: Self-pay | Admitting: *Deleted

## 2013-02-17 NOTE — Telephone Encounter (Signed)
Called patient to remind of appts. For 02-18-13, confirmed appts. With patient 

## 2013-02-18 ENCOUNTER — Ambulatory Visit
Admission: RE | Admit: 2013-02-18 | Discharge: 2013-02-18 | Disposition: A | Discharge status: Home / Self Care | Payer: BC Managed Care – PPO | Source: Ambulatory Visit | Attending: Radiation Oncology | Admitting: Radiation Oncology

## 2013-02-18 ENCOUNTER — Encounter: Payer: Self-pay | Admitting: Radiation Oncology

## 2013-02-18 VITALS — BP 148/93 | HR 69 | Resp 18 | Wt 220.0 lb

## 2013-02-18 DIAGNOSIS — C61 Malignant neoplasm of prostate: Secondary | ICD-10-CM

## 2013-02-18 NOTE — Progress Notes (Signed)
Reports taking flomax as directed. Has one pill left. Questions if he needs to continue and if it is in fact helping. Reports that his urine stream alternates between strong and weak. Denies burning with urination or hematuria. Denies difficulty emptying his bladder. Reports getting up only once during the night to void. Reports intermittent occasions where he leaks urine which cause great frustration.     IPSS 8

## 2013-02-18 NOTE — Progress Notes (Signed)
  Radiation Oncology         [phone] ________________________________  Name: Parley Pidcock MRN: [id number]  Date: 02/18/2013  DOB: 04-Nov-1945  COMPLEX SIMULATION NOTE  NARRATIVE:  The patient was brought to the CT Simulation planning suite today following prostate seed implantation approximately one month ago.  Identity was confirmed.  All relevant records and images related to the planned course of therapy were reviewed.  Then, the patient was set-up supine.  CT images were obtained.  The CT images were loaded into the planning software.  Then the prostate and rectum were contoured.  Treatment planning then occurred.  The implanted iodine 125 seeds were identified by the physics staff for projection of radiation distribution  I have requested : 3D Simulation  I have requested a DVH of the following structures: Prostate and rectum.    ________________________________  Artist Pais Kathrynn Running, M.D.

## 2013-02-18 NOTE — Progress Notes (Signed)
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  He is complaining of increased urinary frequency and urinary hesitation symptoms. He filled out a questionnaire regarding urinary function today providing and overall IPSS score of 8 characterizing his symptoms as moderate.  His pre-implant score was 3.  He has had some leakage when he delays urination. He denies any bowel symptoms.  ALLERGIES:  is allergic to penicillins.  Meds: Current Outpatient Prescriptions  Medication Sig Dispense Refill  . aspirin 81 MG tablet Take 81 mg by mouth as needed.       Marland Kitchen ibuprofen (ADVIL,MOTRIN) 100 MG chewable tablet Chew 100 mg by mouth every 8 (eight) hours as needed for fever.      . Ranitidine HCl (ZANTAC PO) Take by mouth as needed.      . tamsulosin (FLOMAX) 0.4 MG CAPS Take 1 capsule (0.4 mg total) by mouth daily.  30 capsule  1  . ciprofloxacin (CIPRO) 500 MG tablet Take 1 tablet (500 mg total) by mouth 2 (two) times daily.  6 tablet  0  . HYDROcodone-acetaminophen (NORCO) 5-325 MG per tablet Take 1 tablet by mouth every 6 (six) hours as needed for pain.  15 tablet  1   No current facility-administered medications for this encounter.    Physical Findings: The patient is in no acute distress. Patient is alert and oriented.  weight is 220 lb (99.791 kg). His blood pressure is 148/93 and his pulse is 69. His respiration is 18. .  No significant changes.  Lab Findings: Lab Results  Component Value Date   WBC 7.0 01/08/2013   HGB 13.6 01/08/2013   HCT 40.7 01/08/2013   MCV 90.6 01/08/2013   PLT 182 01/08/2013     Radiographic Findings:  Patient underwent CT imaging in our clinic for post implant dosimetry. The CT appears to demonstrate an adequate distribution of radioactive seeds throughout the prostate gland. There no seeds in her near the rectum. I suspect the final radiation plan and dosimetry will show appropriate coverage of the prostate gland.   Impression: The patient is recovering from the effects of radiation. His urinary symptoms should gradually improve over the next 4-6 months. We talked about this today. He is encouraged by his improvement already and is otherwise please with his outcome.   Plan: Today, I spent time talking to the patient about his prostate seed implant and resolving urinary symptoms. We also talked about long-term follow-up for prostate cancer following seed implant. He understands that ongoing PSA determinations and digital rectal exams will help perform surveillance to rule out disease recurrence. He understands what to expect with his PSA measures. Patient was also educated today about some of the long-term effects would radiation including the Small risk for rectal bleeding and possibly erectile dysfunction. Talked about some of the general management approaches to these potential complications. However, I did encourage the patient to contact her office or return at any point if he has questions or concerns related to his previous radiation and prostate cancer.   _____________________________________  Artist Pais. Kathrynn Running,  M.D.

## 2013-03-10 ENCOUNTER — Ambulatory Visit: Admission: RE | Admit: 2013-03-10 | Payer: BC Managed Care – PPO | Source: Ambulatory Visit

## 2013-03-10 ENCOUNTER — Ambulatory Visit
Admission: RE | Admit: 2013-03-10 | Discharge: 2013-03-10 | Disposition: A | Discharge status: Home / Self Care | Payer: BC Managed Care – PPO | Source: Ambulatory Visit | Attending: Radiation Oncology | Admitting: Radiation Oncology

## 2013-03-10 ENCOUNTER — Encounter: Payer: Self-pay | Admitting: Radiation Oncology

## 2013-03-10 DIAGNOSIS — C61 Malignant neoplasm of prostate: Secondary | ICD-10-CM | POA: Insufficient documentation

## 2013-03-15 NOTE — Progress Notes (Signed)
  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 He underwent CT scan complex simulation to delineate the three-dimensional structures of the pelvis and demonstrate the radiation distribution.  Since that time, the seed localization, and 3D planning with dose volume histograms have now been completed.  Results:   Prostate Coverage - The dose of radiation delivered to the 90% or more of the prostate gland (D90) was 112.94% of the prescription dose. This exceeds our goal of greater than 90%. Rectal Sparing - The volume of rectal tissue receiving the prescription dose or higher was 0.03 cc. This falls under our thresholds tolerance of 1.0 cc.  Impression: The prostate seed implant appears to show adequate target coverage and appropriate rectal sparing.  Plan:  The patient will continue to follow with urology for ongoing PSA determinations. I would anticipate a high likelihood for local tumor control with minimal risk for rectal morbidity.  ________________________________  Artist Pais Kathrynn Running, M.D.

## 2013-03-23 ENCOUNTER — Encounter: Payer: Self-pay | Admitting: *Deleted

## 2014-06-23 ENCOUNTER — Encounter: Payer: Self-pay | Admitting: Gastroenterology

## 2014-09-09 HISTORY — PX: COLONOSCOPY: SHX174

## 2014-10-18 ENCOUNTER — Encounter: Payer: Self-pay | Admitting: Internal Medicine

## 2014-12-14 ENCOUNTER — Encounter: Payer: Self-pay | Admitting: Internal Medicine

## 2014-12-15 ENCOUNTER — Telehealth: Payer: Self-pay | Admitting: Internal Medicine

## 2014-12-15 NOTE — Telephone Encounter (Signed)
Pt wants to speak to Dr. Henrene Pastor directly. Let pt know Dr. Henrene Pastor is off today and tomorrow. Pt knows message being sent to Dr. Henrene Pastor to call back.

## 2014-12-15 NOTE — Telephone Encounter (Signed)
Vaughan Basta,  Mr. Ryle needs a last appointment (4 pm) colonoscopy some day, as I will drive him home. Thanks for assisting him with this. Reason is "hx of polyps"  Dr. Henrene Pastor

## 2014-12-16 NOTE — Telephone Encounter (Signed)
Pt scheduled for previsit 12/20/14@4 :30pm, colon scheduled for 01/10/15@4pm . Pt aware of appts.

## 2014-12-20 ENCOUNTER — Ambulatory Visit (AMBULATORY_SURGERY_CENTER): Payer: Self-pay | Admitting: *Deleted

## 2014-12-20 VITALS — Ht 73.0 in | Wt 230.4 lb

## 2014-12-20 DIAGNOSIS — Z86010 Personal history of colonic polyps: Secondary | ICD-10-CM

## 2014-12-20 MED ORDER — MOVIPREP 100 G PO SOLR: ORAL | Status: DC

## 2014-12-20 NOTE — Progress Notes (Signed)
No egg or soy allergy  No anesthesia or intubation problems per pt  No diet medications taken   

## 2015-01-10 ENCOUNTER — Encounter: Payer: Self-pay | Admitting: Internal Medicine

## 2015-01-10 ENCOUNTER — Ambulatory Visit (AMBULATORY_SURGERY_CENTER): Payer: Medicare Other | Admitting: Internal Medicine

## 2015-01-10 VITALS — BP 151/75 | HR 61 | Temp 97.7°F | Resp 20 | Ht 73.0 in | Wt 230.0 lb

## 2015-01-10 DIAGNOSIS — D122 Benign neoplasm of ascending colon: Secondary | ICD-10-CM | POA: Diagnosis not present

## 2015-01-10 DIAGNOSIS — Z86010 Personal history of colonic polyps: Secondary | ICD-10-CM

## 2015-01-10 MED ORDER — SODIUM CHLORIDE 0.9 % IV SOLN: 500.0000 mL | INTRAVENOUS | Status: DC

## 2015-01-10 NOTE — Progress Notes (Signed)
Called to room to assist during endoscopic procedure.  Patient ID and intended procedure confirmed with present staff. Received instructions for my participation in the procedure from the performing physician.  

## 2015-01-10 NOTE — Patient Instructions (Signed)
YOU HAD AN ENDOSCOPIC PROCEDURE TODAY AT [facility]:   Refer to the procedure report that was given to you for any specific questions about what was found during the examination.  If the procedure report does not answer your questions, please call your gastroenterologist to clarify.  If you requested that your care partner not be given the details of your procedure findings, then the procedure report has been included in a sealed envelope for you to review at your convenience later.  YOU SHOULD EXPECT: Some feelings of bloating in the abdomen. Passage of more gas than usual.  Walking can help get rid of the air that was put into your GI tract during the procedure and reduce the bloating. If you had a lower endoscopy (such as a colonoscopy or flexible sigmoidoscopy) you may notice spotting of blood in your stool or on the toilet paper. If you underwent a bowel prep for your procedure, you may not have a normal bowel movement for a few days.  Please Note:  You might notice some irritation and congestion in your nose or some drainage.  This is from the oxygen used during your procedure.  There is no need for concern and it should clear up in a day or so.  SYMPTOMS TO REPORT IMMEDIATELY:   Following lower endoscopy (colonoscopy or flexible sigmoidoscopy):  Excessive amounts of blood in the stool  Significant tenderness or worsening of abdominal pains  Swelling of the abdomen that is new, acute  Fever of 100F or higher    For urgent or emergent issues, a gastroenterologist can be reached at any hour by calling [phone].   DIET: Your first meal following the procedure should be a small meal and then it is ok to progress to your normal diet. Heavy or fried foods are harder to digest and may make you feel nauseous or bloated.  Likewise, meals heavy in dairy and vegetables can increase bloating.  Drink plenty of fluids but you should avoid alcoholic beverages for 24  hours.  ACTIVITY:  You should plan to take it easy for the rest of today and you should NOT DRIVE or use heavy machinery until tomorrow (because of the sedation medicines used during the test).    FOLLOW UP: Our staff will call the number listed on your records the next business day following your procedure to check on you and address any questions or concerns that you may have regarding the information given to you following your procedure. If we do not reach you, we will leave a message.  However, if you are feeling well and you are not experiencing any problems, there is no need to return our call.  We will assume that you have returned to your regular daily activities without incident.  If any biopsies were taken you will be contacted by phone or by letter within the next 1-3 weeks.  Please call us at [phone] if you have not heard about the biopsies in 3 weeks.    SIGNATURES/CONFIDENTIALITY: You and/or your care partner have signed paperwork which will be entered into your electronic medical record.  These signatures attest to the fact that that the information above on your After Visit Summary has been reviewed and is understood.  Full responsibility of the confidentiality of this discharge information lies with you and/or your care-partner.   Resume medications. Information given on polyps,diverticulosis and high fiber meal with discharge instructions.

## 2015-01-10 NOTE — Progress Notes (Signed)
1626 pt. Temp= 97.9,pt. Stated that he feels great.

## 2015-01-10 NOTE — Progress Notes (Signed)
Report to PACU, RN, vss, BBS= Clear.  Pt drowsy arousabl e to voice pon entering PACU pt spits up clear/redish liquid  Head immediately dropped and suction available (nothing left to suction) pt answere to name and said ok .  A couple coughs after spitting up was all.  Patient states he is fine

## 2015-01-10 NOTE — Progress Notes (Signed)
1612-npt. Awake and talking,made pt. Aware that he was spitting up clear secretions instructed him to notify us if he has chills,fever immediately,verbalize understanding Dr. Henrene Pastor aware.

## 2015-01-10 NOTE — Op Note (Signed)
[facility]  [address] [place], [phone]   COLONOSCOPY PROCEDURE REPORT  PATIENT: Eric Dominguez, Eric Dominguez  MR#: [id number] BIRTHDATE: [birth year] , 68  yrs. old GENDER: male ENDOSCOPIST: Eustace Quail, MD REFERRED XY:[id number] Program Recall PROCEDURE DATE:  01/10/2015 PROCEDURE:   Colonoscopy, surveillance and Colonoscopy with snare polypectomy x 3 First Screening Colonoscopy - Avg.  risk and is 50 yrs.  old or older - No.  Prior Negative Screening - Now for repeat screening. N/A  History of Adenoma - Now for follow-up colonoscopy & has been > or = to 3 yrs.  Yes hx of adenoma.  Has been 3 or more years since last colonoscopy.  Polyps Removed Today ASA CLASS:   Class II INDICATIONS:Surveillance due to prior colonic neoplasia and PH Colon Adenoma. Index examination May 2009 with small tubular adenoma. MEDICATIONS: Monitored anesthesia care and Propofol 400 mg IV  DESCRIPTION OF PROCEDURE:   After the risks benefits and alternatives of the procedure were thoroughly explained, informed consent was obtained.  The digital rectal exam revealed no abnormalities of the rectum.   The LB [id number]  endoscope was introduced through the anus and advanced to the cecum, which was identified by both the appendix and ileocecal valve. No adverse events experienced.   The quality of the prep was excellent. (MoviPrep was used)  The instrument was then slowly withdrawn as the colon was fully examined.  COLON FINDINGS: Three polyps ranging between 3-72mm in size were found in the ascending colon.  A polypectomy was performed with a cold snare.  The resection was complete, the polyp tissue was completely retrieved and sent to histology.   There was mild diverticulosis noted in the left colon.   The examination was otherwise normal.  Retroflexed views revealed internal hemorrhoids. The time to cecum = 3.0 Withdrawal time = 14.3   The scope was withdrawn and the procedure  completed.  COMPLICATIONS: There were no immediate complications.  ENDOSCOPIC IMPRESSION: 1.   Three small polyps were found in the ascending colon; polypectomy was performed with a cold snare 2.   Mild diverticulosis was noted in the left colon 3.   The examination was otherwise normal  RECOMMENDATIONS: 1. Follow up colonoscopy in 5 years  eSigned:  Eustace Quail, MD 01/10/2015 4:03 PM   cc: The Patient and W.  Lutricia Feil, MD

## 2015-01-11 ENCOUNTER — Telehealth: Payer: Self-pay | Admitting: *Deleted

## 2015-01-11 NOTE — Telephone Encounter (Signed)
  Follow up Call-  Call back number 01/10/2015  Post procedure Call Back phone  # [phone]  Permission to leave phone message Yes     Patient questions:  Do you have a fever, pain , or abdominal swelling? No. Pain Score  0 *  Have you tolerated food without any problems? Yes.    Have you been able to return to your normal activities? Yes.    Do you have any questions about your discharge instructions: Diet   No. Medications  No. Follow up visit  No.  Do you have questions or concerns about your Care? No.  Actions: * If pain score is 4 or above: No action needed, pain <4.

## 2015-01-17 ENCOUNTER — Encounter: Payer: Self-pay | Admitting: Internal Medicine

## 2015-03-31 ENCOUNTER — Encounter: Payer: Self-pay | Admitting: Family Medicine

## 2015-03-31 ENCOUNTER — Encounter (INDEPENDENT_AMBULATORY_CARE_PROVIDER_SITE_OTHER): Payer: Self-pay

## 2015-03-31 ENCOUNTER — Ambulatory Visit (INDEPENDENT_AMBULATORY_CARE_PROVIDER_SITE_OTHER): Payer: Medicare Other | Admitting: Family Medicine

## 2015-03-31 DIAGNOSIS — M2141 Flat foot [pes planus] (acquired), right foot: Secondary | ICD-10-CM

## 2015-03-31 DIAGNOSIS — M2142 Flat foot [pes planus] (acquired), left foot: Secondary | ICD-10-CM

## 2015-03-31 DIAGNOSIS — M2040 Other hammer toe(s) (acquired), unspecified foot: Secondary | ICD-10-CM | POA: Insufficient documentation

## 2015-03-31 DIAGNOSIS — R269 Unspecified abnormalities of gait and mobility: Secondary | ICD-10-CM | POA: Diagnosis not present

## 2015-03-31 DIAGNOSIS — M21629 Bunionette of unspecified foot: Secondary | ICD-10-CM | POA: Insufficient documentation

## 2015-03-31 DIAGNOSIS — M205X1 Other deformities of toe(s) (acquired), right foot: Secondary | ICD-10-CM | POA: Diagnosis not present

## 2015-03-31 DIAGNOSIS — M2140 Flat foot [pes planus] (acquired), unspecified foot: Secondary | ICD-10-CM | POA: Insufficient documentation

## 2015-03-31 NOTE — Progress Notes (Signed)
  Eric Dominguez - 69 y.o. male MRN [id number]  Date of birth: 11-04-1945 Eric Dominguez is a 69 y.o. male who presents today for R foot pain.    R foot Pain, initial visit - right foot pain has been ongoing for several months up to 1 year now. He has previously seen at [facility] for this pain and was referred here for further evaluation and management including possible orthotics. Pain is mostly localized along the lateral aspect of the fifth metatarsal/fifth phalanx. Pain is worsened with any type of closed shoe box apparel. He has previously tried padding, wider shoe box, medication including ibuprofen and topical. Denies any paresthesias going into his foot, or previous trauma to the right lower extremity. He does golf quite a bit and is on his feet throughout the day.  PMHx - Updated and reviewed.  Contributory factors include: Prostate cancer PSHx - Updated and reviewed.  Contributory factors include:  Noncontributory FHx - Updated and reviewed.  Contributory factors include:  Noncontributory Medications - ibuprofen when necessary   ROS Per HPI   Exam:  Filed Vitals:   03/31/15 0910  BP: 133/70   Gen: NAD Cardiorespiratory - Normal respiratory effort/rate.  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 He has a bunionette deformity at the fifth MTP P joint. Neurovascularly intact distally.

## 2015-03-31 NOTE — Progress Notes (Signed)
Patient ID: Eric Dominguez, male   DOB: Oct 22, 1945, 69 y.o.   MRN: [id number] [facility]: Attending Note: I have reviewed the chart, discussed wit the Sports Medicine Fellow. I agree with assessment and treatment plan as detailed in the [place] note. He will try out the temp insoles over next few days and bring his golf and regular shoes to next visit for custom molded orthotics.

## 2015-03-31 NOTE — Assessment & Plan Note (Signed)
Patient with multiple deformities of both feet however right is greater than left. 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 He has a significant bunionette at the fifth MTP along with formation of the pip joint of the fifth phalanx. he does have evidence of collapse of this transverse arch with dropping of the deep transverse metatarsal ligaments. -he was fitted today in green insoles with scaphoid padding and padding for the bunionette and local formation at the fifth phalanx. -He would benefit greatly from custom orthotics possibly with a fifth ray post to help offload some of the pressure on his bunionette. -Eventually he may need bunionette surgery by a foot surgeon, however we will evaluate after several months of orthotics.

## 2015-04-03 ENCOUNTER — Ambulatory Visit: Payer: Medicare Other | Admitting: Family Medicine

## 2015-04-12 ENCOUNTER — Ambulatory Visit: Payer: Medicare Other | Admitting: Sports Medicine

## 2015-04-17 ENCOUNTER — Ambulatory Visit: Payer: Medicare Other | Admitting: Family Medicine

## 2015-05-01 ENCOUNTER — Encounter: Payer: Medicare Other | Admitting: Family Medicine

## 2015-05-08 ENCOUNTER — Ambulatory Visit (INDEPENDENT_AMBULATORY_CARE_PROVIDER_SITE_OTHER): Payer: Medicare Other | Admitting: Sports Medicine

## 2015-05-08 VITALS — BP 130/68 | Ht 73.0 in | Wt 215.0 lb

## 2015-05-08 DIAGNOSIS — M205X1 Other deformities of toe(s) (acquired), right foot: Secondary | ICD-10-CM

## 2015-05-08 NOTE — Progress Notes (Signed)
Patient ID: Eric Dominguez, male   DOB: 03/25/1946, 69 y.o.   MRN: [id number]   Patient comes in today for custom orthotics. Please see the previous office notes for details regarding history and physical exam findings. In short he has a bunionette deformity on the right foot which is causing him pain. We will construct him some custom orthotics to which we will add metatarsal pads to hopefully elevate the transverse arch. I also gave him an arch strap to wear for compression. The orthotics fit quite comfortably in his dress shoes prior to leaving. 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 If these orthotics do not fit in his golf shoes then he may return with those and we can fit those with orthotics as well.  Total time spent with the patient was 30 minutes with greater than 50% of the time spent in face-to-face consultation discussing orthotic construction, instruction, and fitting. Patient will follow-up as needed.   Patient was fitted for a : standard, cushioned, semi-rigid orthotic. The orthotic was heated and afterward the patient stood on the orthotic blank positioned on the orthotic stand. The patient was positioned in subtalar neutral position and 10 degrees of ankle dorsiflexion in a weight bearing stance. After completion of molding, a stable base was applied to the orthotic blank. The blank was ground to a stable position for weight bearing. Size: 11 Base: Blue EVA Posting: none Additional orthotic padding: B/L metatarsal pads

## 2017-07-10 ENCOUNTER — Other Ambulatory Visit (HOSPITAL_COMMUNITY): Payer: Self-pay

## 2017-07-29 ENCOUNTER — Encounter (HOSPITAL_COMMUNITY): Payer: Self-pay | Admitting: Physician Assistant

## 2017-07-29 DIAGNOSIS — M1711 Unilateral primary osteoarthritis, right knee: Secondary | ICD-10-CM

## 2017-07-29 DIAGNOSIS — I440 Atrioventricular block, first degree: Secondary | ICD-10-CM | POA: Diagnosis present

## 2017-07-29 DIAGNOSIS — K219 Gastro-esophageal reflux disease without esophagitis: Secondary | ICD-10-CM | POA: Diagnosis present

## 2017-07-29 HISTORY — DX: Unilateral primary osteoarthritis, right knee: M17.11

## 2017-07-29 NOTE — H&P (Signed)
TOTAL KNEE ADMISSION H&P  Patient is being admitted for right total knee arthroplasty.  Subjective:  Chief Complaint:right knee pain.  HPI: Eric Dominguez, 71 y.o. male, has a history of pain and functional disability in the right knee due to trauma and has failed non-surgical conservative treatments for greater than 12 weeks to includeNSAID's and/or analgesics, corticosteriod injections, viscosupplementation injections, flexibility and strengthening excercises, supervised PT with diminished ADL's post treatment, use of assistive devices, weight reduction as appropriate and activity modification.  Onset of symptoms was gradual, starting 10 years ago with gradually worsening course since that time. The patient noted no past surgery on the right knee(s).  Patient currently rates pain in the right knee(s) at 6 out of 10 with activity. Patient has night pain, worsening of pain with activity and weight bearing, pain that interferes with activities of daily living, crepitus and joint swelling.  Patient has evidence of subchondral sclerosis, periarticular osteophytes and joint space narrowing by imaging studies. There is no active infection.  Patient Active Problem List   Diagnosis Date Noted  . Primary localized osteoarthrosis of the knee, right 07/29/2017  . GERD (gastroesophageal reflux disease)   . First degree heart block   . Pes planus 03/31/2015  . Abnormality of gait 03/31/2015  . Bunionette 03/31/2015  . Hammertoe 03/31/2015  . Prostate cancer ([place]) 11/26/2012  . BUNDLE BRANCH BLOCK, RIGHT 11/18/2007  . Hyperlipemia 10/08/2006  . Essential hypertension 10/08/2006   Past Medical History:  Diagnosis Date  . First degree heart block   . GERD (gastroesophageal reflux disease)    OCCASIONAL ZANTAC AT HS  . Hypertension   . Primary localized osteoarthrosis of the knee, right 07/29/2017  . Prostate cancer ([place]) 11/26/12   gleason3+4=7,& 3+3=6,PSA=4.65 volume=42.5cc  . RBBB     Past  Surgical History:  Procedure Laterality Date  . COLONOSCOPY    . CYSTOSCOPY N/A 01/14/2013   Procedure: CYSTOSCOPY FLEXIBLE;  Surgeon: Malka So, MD;  Location: [facility];  Service: Urology;  Laterality: N/A;  no seeds found in bladder  . LEFT ANKLE SURGERY  2012  . RADIOACTIVE SEED IMPLANT N/A 01/14/2013   Procedure: RADIOACTIVE SEED IMPLANT;  Surgeon: Malka So, MD;  [facility];  Service: Urology;  Laterality: N/A;  68    seeds implanted  . REPAIR TORN BICEP  Right 2012    No current facility-administered medications for this encounter.    Current Outpatient Medications  Medication Sig Dispense Refill Last Dose  . atorvastatin (LIPITOR) 20 MG tablet Take 20 mg at bedtime by mouth.   07/28/2017 at Unknown time  . HYDROcodone-acetaminophen (NORCO/VICODIN) 5-325 MG tablet Take 1 tablet every 4 (four) hours as needed by mouth for moderate pain.   07/28/2017 at Unknown time  . lisinopril (PRINIVIL,ZESTRIL) 20 MG tablet Take 20 mg daily by mouth.    07/29/2017 at Unknown time  . Menthol, Topical Analgesic, (BIOFREEZE EX) Apply 1 application as needed topically (for pain).   07/28/2017 at Unknown time  . MOVIPREP 100 G SOLR Moviprep as directed, no substitutions 1 kit 0   . naproxen sodium (ALEVE) 220 MG tablet Take 440 mg 2 (two) times daily as needed by mouth (for pain/headaches).   07/28/2017 at Unknown time  . aspirin 81 MG tablet Take 81 mg at bedtime by mouth.    More than a month at Unknown time   Allergies  Allergen Reactions  . Penicillins Rash and Other (See Comments)  Fever Has patient had a PCN reaction causing immediate rash, facial/tongue/throat swelling, SOB or lightheadedness with hypotension: No Has patient had a PCN reaction causing severe rash involving mucus membranes or skin necrosis: No Has patient had a PCN reaction that required hospitalization: No Has patient had a PCN reaction occurring within the last 10 years: No If  all of the above answers are "NO", then may proceed with Cephalosporin use.     Social History   Tobacco Use  . Smoking status: Never Smoker  . Smokeless tobacco: Never Used  Substance Use Topics  . Alcohol use: Yes    Alcohol/week: 7.0 oz    Types: 14 Standard drinks or equivalent per week    Family History  Problem Relation Age of Onset  . Cancer Father        Prostate 32 y ago/brachytherapy  . Colon cancer Neg Hx   . Esophageal cancer Neg Hx   . Stomach cancer Neg Hx   . Rectal cancer Neg Hx      Review of Systems  Constitutional: Negative.   HENT: Negative.   Eyes: Negative.   Respiratory: Negative.   Cardiovascular: Negative.   Gastrointestinal: Negative.   Genitourinary: Negative.   Musculoskeletal: Positive for back pain and joint pain.  Skin: Negative.   Neurological: Negative.   Endo/Heme/Allergies: Negative.   Psychiatric/Behavioral: Negative.     Objective:  Physical Exam  Constitutional: He is oriented to person, place, and time. He appears well-developed and well-nourished.  HENT:  Head: Normocephalic and atraumatic.  Mouth/Throat: Oropharynx is clear and moist.  Eyes: Conjunctivae are normal. Pupils are equal, round, and reactive to light.  Neck: Normal range of motion. Neck supple.  Cardiovascular: Normal rate and regular rhythm.  Respiratory: Effort normal and breath sounds normal.  GI: Soft.  Genitourinary:  Genitourinary Comments: Not pertinent to current symptomatology therefore not examined.  Musculoskeletal:  Examination of his right knee reveals moderate valgus deformity with diffuse pain.  1+ synovitis.  Range of motion -5 to 115 degrees.  Knee is stable.   Examination of his left ankle reveals diffuse pain.  Decreased range of motion by 50%.  Neurologic exam: Distal motor and sensory examination is within normal limits.     Neurological: He is alert and oriented to person, place, and time.  Skin: Skin is warm and dry.  Psychiatric: He  has a normal mood and affect. His behavior is normal.    Vital signs in last 24 hours: Temp:  [97.3 F (36.3 C)] 97.3 F (36.3 C) (11/20 1400) Pulse Rate:  [77] 77 (11/20 1400) BP: (113)/(70) 113/70 (11/20 1400) SpO2:  [97 %] 97 % (11/20 1400) Weight:  [95.1 kg (209 lb 9.6 oz)] 95.1 kg (209 lb 9.6 oz) (11/20 1400)  Labs:   Estimated body mass index is 27.65 kg/m as calculated from the following:   Height as of this encounter: '6\' 1"'$  (1.854 m).   Weight as of this encounter: 95.1 kg (209 lb 9.6 oz).   Imaging Review Plain radiographs demonstrate severe degenerative joint disease of the right knee(s). The overall alignment issignificant varus. The bone quality appears to be good for age and reported activity level.  Assessment/Plan:  End stage arthritis, right knee  Principal Problem:   Primary localized osteoarthrosis of the knee, right Active Problems:   Hyperlipemia   Essential hypertension   BUNDLE BRANCH BLOCK, RIGHT   Prostate cancer ([place])   Abnormality of gait   GERD (gastroesophageal reflux disease)  First degree heart block   The patient history, physical examination, clinical judgment of the provider and imaging studies are consistent with end stage degenerative joint disease of the right knee(s) and total knee arthroplasty is deemed medically necessary. The treatment options including medical management, injection therapy arthroscopy and arthroplasty were discussed at length. The risks and benefits of total knee arthroplasty were presented and reviewed. The risks due to aseptic loosening, infection, stiffness, patella tracking problems, thromboembolic complications and other imponderables were discussed. The patient acknowledged the explanation, agreed to proceed with the plan and consent was signed. Patient is being admitted for inpatient treatment for surgery, pain control, PT, OT, prophylactic antibiotics, VTE prophylaxis, progressive ambulation and ADL's and  discharge planning. The patient is planning to be discharged home with home health services  At two weeks post op he will start outpatient PT at [facility] on [address]

## 2017-07-29 NOTE — Pre-Procedure Instructions (Signed)
Eric Dominguez  07/29/2017      [facility] [address], [place] [address] Phone: [phone] Fax: [phone]    Your procedure is scheduled on December 3  Report to [facility] at [facility].  Call this number if you have problems the morning of surgery:  [phone]   Remember:  Do not eat food or drink liquids after midnight.  Continue all medications as directed by your physician except follow these medication instructions before surgery below   Take these medicines the morning of surgery with A SIP OF WATER  HYDROcodone-acetaminophen (NORCO/VICODIN)  7 days prior to surgery STOP taking any Aspirin(unless otherwise instructed by your surgeon), Aleve, Naproxen, Ibuprofen, Motrin, Advil, Goody's, BC's, all herbal medications, fish oil, and all vitamins  Follow your doctors instructions regarding your Aspirin.  If no instructions were given by your doctor, then you will need to call the prescribing office office to get instructions.      Do not wear jewelry  Do not wear lotions, powders, or cologne, or deoderant.  Men may shave face and neck.  Do not bring valuables to the hospital.  [facility] is not responsible for any belongings or valuables.  Contacts, dentures or bridgework may not be worn into surgery.  Leave your suitcase in the car.  After surgery it may be brought to your room.  For patients admitted to the hospital, discharge time will be determined by your treatment team.  Patients discharged the day of surgery will not be allowed to drive home.    Special instructions:   [place]- Preparing For Surgery  Before surgery, you can play an important role. Because skin is not sterile, your skin needs to be as free of germs as possible. You can reduce the number of germs on your skin by washing with CHG (chlorahexidine gluconate) Soap before surgery.  CHG is an  antiseptic cleaner which kills germs and bonds with the skin to continue killing germs even after washing.  Please do not use if you have an allergy to CHG or antibacterial soaps. If your skin becomes reddened/irritated stop using the CHG.  Do not shave (including legs and underarms) for at least 48 hours prior to first CHG shower. It is OK to shave your face.  Please follow these instructions carefully.   1. Shower the NIGHT BEFORE SURGERY and the MORNING OF SURGERY with CHG.   2. If you chose to wash your hair, wash your hair first as usual with your normal shampoo.  3. After you shampoo, rinse your hair and body thoroughly to remove the shampoo.  4. Use CHG as you would any other liquid soap. You can apply CHG directly to the skin and wash gently with a scrungie or a clean washcloth.   5. Apply the CHG Soap to your body ONLY FROM THE NECK DOWN.  Do not use on open wounds or open sores. Avoid contact with your eyes, ears, mouth and genitals (private parts). Wash Face and genitals (private parts)  with your normal soap.  6. Wash thoroughly, paying special attention to the area where your surgery will be performed.  7. Thoroughly rinse your body with warm water from the neck down.  8. DO NOT shower/wash with your normal soap after using and rinsing off the CHG Soap.  9. Pat yourself dry with a CLEAN TOWEL.  10. Wear CLEAN PAJAMAS  to bed the night before surgery, wear comfortable clothes the morning of surgery  11. Place CLEAN SHEETS on your bed the night of your first shower and DO NOT SLEEP WITH PETS.    Day of Surgery: Do not apply any deodorants/lotions. Please wear clean clothes to the hospital/surgery center.      Please read over the following fact sheets that you were given.

## 2017-08-01 ENCOUNTER — Encounter (HOSPITAL_COMMUNITY): Payer: Self-pay

## 2017-08-01 ENCOUNTER — Other Ambulatory Visit: Payer: Self-pay

## 2017-08-01 ENCOUNTER — Encounter (HOSPITAL_COMMUNITY)
Admission: RE | Admit: 2017-08-01 | Discharge: 2017-08-01 | Disposition: A | Discharge status: Home / Self Care | Payer: Medicare Other | Source: Ambulatory Visit | Attending: Orthopedic Surgery | Admitting: Orthopedic Surgery

## 2017-08-01 DIAGNOSIS — I4510 Unspecified right bundle-branch block: Secondary | ICD-10-CM | POA: Insufficient documentation

## 2017-08-01 DIAGNOSIS — Z01810 Encounter for preprocedural cardiovascular examination: Secondary | ICD-10-CM | POA: Insufficient documentation

## 2017-08-01 DIAGNOSIS — Z01812 Encounter for preprocedural laboratory examination: Secondary | ICD-10-CM | POA: Diagnosis not present

## 2017-08-01 DIAGNOSIS — I440 Atrioventricular block, first degree: Secondary | ICD-10-CM | POA: Insufficient documentation

## 2017-08-01 HISTORY — DX: Inflammatory liver disease, unspecified: K75.9

## 2017-08-01 LAB — COMPREHENSIVE METABOLIC PANEL
ALT: 20 U/L (ref 17–63)
AST: 24 U/L (ref 15–41)
Albumin: 3.9 g/dL (ref 3.5–5.0)
Alkaline Phosphatase: 66 U/L (ref 38–126)
Anion gap: 6 (ref 5–15)
BUN: 14 mg/dL (ref 6–20)
CHLORIDE: 105 mmol/L (ref 101–111)
CO2: 26 mmol/L (ref 22–32)
Calcium: 9.2 mg/dL (ref 8.9–10.3)
Creatinine, Ser: 0.74 mg/dL (ref 0.61–1.24)
GFR calc Af Amer: >60 mL/min (ref >60)
GFR calc non Af Amer: >60 mL/min (ref >60)
GLUCOSE: 115 mg/dL — AB (ref 65–99)
POTASSIUM: 4.3 mmol/L (ref 3.5–5.1)
Sodium: 137 mmol/L (ref 135–145)
Total Bilirubin: 1.1 mg/dL (ref 0.3–1.2)
Total Protein: 6.7 g/dL (ref 6.5–8.1)

## 2017-08-01 LAB — CBC WITH DIFFERENTIAL/PLATELET
BASOS ABS: 0 10*3/uL (ref 0.0–0.1)
BASOS PCT: 0 %
EOS PCT: 3 %
Eosinophils Absolute: 0.2 10*3/uL (ref 0.0–0.7)
HEMATOCRIT: 39.4 % (ref 39.0–52.0)
Hemoglobin: 13 g/dL (ref 13.0–17.0)
LYMPHS PCT: 33 %
Lymphs Abs: 2.2 10*3/uL (ref 0.7–4.0)
MCH: 30.4 pg (ref 26.0–34.0)
MCHC: 33 g/dL (ref 30.0–36.0)
MCV: 92.1 fL (ref 78.0–100.0)
MONO ABS: 0.7 10*3/uL (ref 0.1–1.0)
Monocytes Relative: 11 %
NEUTROS ABS: 3.5 10*3/uL (ref 1.7–7.7)
Neutrophils Relative %: 53 %
PLATELETS: 219 10*3/uL (ref 150–400)
RBC: 4.28 MIL/uL (ref 4.22–5.81)
RDW: 13.9 % (ref 11.5–15.5)
WBC: 6.6 10*3/uL (ref 4.0–10.5)

## 2017-08-01 LAB — PROTIME-INR
INR: 0.93
Prothrombin Time: 12.4 seconds (ref 11.4–15.2)

## 2017-08-01 LAB — SURGICAL PCR SCREEN
MRSA, PCR: NEGATIVE
STAPHYLOCOCCUS AUREUS: NEGATIVE

## 2017-08-01 LAB — TYPE AND SCREEN
ABO/RH(D): A POS
ANTIBODY SCREEN: NEGATIVE

## 2017-08-01 LAB — APTT: APTT: 28 s (ref 24–36)

## 2017-08-01 LAB — ABO/RH: ABO/RH(D): A POS

## 2017-08-01 NOTE — Progress Notes (Addendum)
PCP is Dr Georgette Dover States he saw a cardiologist years ago, but doesn't remember his name. Denies ever having a card cath, or echo. States he had a stress test years ago, any everything was fine. Denies any chest pain, fever, or cough Reports his last dose of aspirin was Aug 2018 Reports he had cervical surgery at the surgical center [facility] area a few months ago with Dr Annette Stable. Request sent for any heart studies, EKG and last office note from Dr Brigitte Pulse.

## 2017-08-02 LAB — URINE CULTURE: Culture: NO GROWTH

## 2017-08-04 NOTE — Progress Notes (Signed)
Anesthesia Chart Review: Patient is a 71 year old male scheduled for right TKR on 08/11/17 by Dr. Elsie Saas.   History includes never smoker,  prostate cancer (s/p radioactive seed implant 01/14/13, Dr. Irine Seal), GERD, abnormal EKG (right BBB, first degree AV block), HTN, hepatitis (not specified) '75, cervical spine surgery  (11/2015, 08/2016; [facility]). PCP notes indicate history of melanoma in situ (s/p resection). 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   PCP is Dr. Marton Redwood at [facility] Last visit 02/24/17. He is aware of surgery plans. (Patient reported that he does not see a cardiologist on a regular basis, but had a "normal" stress test several years ago. He does not recall where it was done.)  Meds include ASA 81 mg (not taken since 04/2017), Lipitor, Norco, lisinopril.  EKG 08/01/17: NSR, LAD, non-specific intra-ventricular conduction block. 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 He denied chest pain at PAT.   Preoperative labs noted. Cr 0.74, random glucose 115. CBC, PT/PTT WNL. Urine culture showed no growth.   Overall, EKG appears stable since 2014 (except RBBB now non-specific intra-ventricular conduction block). He denied chest pain at PAT. Reviewed EKGs with anesthesiologist Dr. Jenita Seashore. If patient remains asymptomatic from a CV standpoint and otherwise no acute change then it is anticipated that he can proceed as planned.  [facility] Stay [facility]/Anesthesiology Phone [phone] 08/04/2017 4:45 PM

## 2017-08-08 MED ORDER — TRANEXAMIC ACID 1000 MG/10ML IV SOLN
1000.0000 mg | INTRAVENOUS | Status: AC
  Administered 2017-08-11: 1000 mg via INTRAVENOUS
  Filled 2017-08-08: qty 1100

## 2017-08-08 MED ORDER — LACTATED RINGERS IV SOLN: INTRAVENOUS | Status: DC

## 2017-08-11 ENCOUNTER — Ambulatory Visit (HOSPITAL_COMMUNITY): Payer: Medicare Other | Admitting: Certified Registered Nurse Anesthetist

## 2017-08-11 ENCOUNTER — Encounter (HOSPITAL_COMMUNITY): Payer: Self-pay

## 2017-08-11 ENCOUNTER — Other Ambulatory Visit: Payer: Self-pay

## 2017-08-11 ENCOUNTER — Encounter (HOSPITAL_COMMUNITY)
Admission: RE | Disposition: A | Discharge status: Home Health | Payer: Self-pay | Source: Ambulatory Visit | Attending: Orthopedic Surgery

## 2017-08-11 ENCOUNTER — Ambulatory Visit (HOSPITAL_COMMUNITY): Payer: Medicare Other | Admitting: Vascular Surgery

## 2017-08-11 ENCOUNTER — Inpatient Hospital Stay (HOSPITAL_COMMUNITY)
Admission: RE | Admit: 2017-08-11 | Discharge: 2017-08-13 | DRG: 470 | Disposition: A | Discharge status: Home Health | Payer: Medicare Other | Source: Ambulatory Visit | Attending: Orthopedic Surgery | Admitting: Orthopedic Surgery

## 2017-08-11 DIAGNOSIS — R269 Unspecified abnormalities of gait and mobility: Secondary | ICD-10-CM

## 2017-08-11 DIAGNOSIS — M1711 Unilateral primary osteoarthritis, right knee: Secondary | ICD-10-CM | POA: Diagnosis present

## 2017-08-11 DIAGNOSIS — M25561 Pain in right knee: Secondary | ICD-10-CM | POA: Diagnosis present

## 2017-08-11 DIAGNOSIS — Z923 Personal history of irradiation: Secondary | ICD-10-CM | POA: Diagnosis not present

## 2017-08-11 DIAGNOSIS — I440 Atrioventricular block, first degree: Secondary | ICD-10-CM | POA: Diagnosis present

## 2017-08-11 DIAGNOSIS — I4510 Unspecified right bundle-branch block: Secondary | ICD-10-CM | POA: Diagnosis present

## 2017-08-11 DIAGNOSIS — Z809 Family history of malignant neoplasm, unspecified: Secondary | ICD-10-CM

## 2017-08-11 DIAGNOSIS — K219 Gastro-esophageal reflux disease without esophagitis: Secondary | ICD-10-CM | POA: Diagnosis present

## 2017-08-11 DIAGNOSIS — K759 Inflammatory liver disease, unspecified: Secondary | ICD-10-CM | POA: Diagnosis present

## 2017-08-11 DIAGNOSIS — Z8546 Personal history of malignant neoplasm of prostate: Secondary | ICD-10-CM

## 2017-08-11 DIAGNOSIS — I10 Essential (primary) hypertension: Secondary | ICD-10-CM | POA: Diagnosis present

## 2017-08-11 DIAGNOSIS — E785 Hyperlipidemia, unspecified: Secondary | ICD-10-CM | POA: Diagnosis present

## 2017-08-11 DIAGNOSIS — C61 Malignant neoplasm of prostate: Secondary | ICD-10-CM | POA: Diagnosis present

## 2017-08-11 DIAGNOSIS — Z880 Allergy status to penicillin: Secondary | ICD-10-CM | POA: Diagnosis not present

## 2017-08-11 HISTORY — PX: HARDWARE REMOVAL: SHX979

## 2017-08-11 HISTORY — PX: TOTAL KNEE ARTHROPLASTY: SHX125

## 2017-08-11 HISTORY — DX: Unilateral primary osteoarthritis, right knee: M17.11

## 2017-08-11 SURGERY — ARTHROPLASTY, KNEE, TOTAL
Anesthesia: Monitor Anesthesia Care | Site: Knee | Laterality: Right

## 2017-08-11 MED ORDER — PROPOFOL 10 MG/ML IV BOLUS
INTRAVENOUS | Status: AC
  Filled 2017-08-11: qty 20

## 2017-08-11 MED ORDER — HYDROMORPHONE HCL 1 MG/ML IJ SOLN
INTRAMUSCULAR | Status: AC
  Administered 2017-08-11: 0.5 mg via INTRAVENOUS
  Filled 2017-08-11: qty 1

## 2017-08-11 MED ORDER — PROPOFOL 10 MG/ML IV BOLUS
INTRAVENOUS | Status: DC | PRN
  Administered 2017-08-11: 20 mg via INTRAVENOUS

## 2017-08-11 MED ORDER — CHLORHEXIDINE GLUCONATE 4 % EX LIQD: 60.0000 mL | Freq: Once | CUTANEOUS | Status: DC

## 2017-08-11 MED ORDER — 0.9 % SODIUM CHLORIDE (POUR BTL) OPTIME
TOPICAL | Status: DC | PRN
  Administered 2017-08-11: 1000 mL

## 2017-08-11 MED ORDER — HYDROMORPHONE HCL 1 MG/ML IJ SOLN
0.2500 mg | INTRAMUSCULAR | Status: DC | PRN
Start: 2017-08-11 — End: 2017-08-11
  Administered 2017-08-11 (×4): 0.5 mg via INTRAVENOUS

## 2017-08-11 MED ORDER — MENTHOL 3 MG MT LOZG
1.0000 | LOZENGE | OROMUCOSAL | Status: DC | PRN
Start: 2017-08-11 — End: 2017-08-13

## 2017-08-11 MED ORDER — PROPOFOL 500 MG/50ML IV EMUL
INTRAVENOUS | Status: DC | PRN
  Administered 2017-08-11: 75 ug/kg/min via INTRAVENOUS

## 2017-08-11 MED ORDER — ONDANSETRON HCL 4 MG PO TABS: 4.0000 mg | ORAL_TABLET | Freq: Four times a day (QID) | ORAL | Status: DC | PRN

## 2017-08-11 MED ORDER — ACETAMINOPHEN 500 MG PO TABS
1000.0000 mg | ORAL_TABLET | Freq: Four times a day (QID) | ORAL | Status: AC
  Administered 2017-08-11 – 2017-08-12 (×3): 1000 mg via ORAL
  Filled 2017-08-11 (×4): qty 2

## 2017-08-11 MED ORDER — LISINOPRIL 20 MG PO TABS
20.0000 mg | ORAL_TABLET | Freq: Every day | ORAL | Status: DC
Start: 2017-08-13 — End: 2017-08-13
  Administered 2017-08-13: 20 mg via ORAL
  Filled 2017-08-11: qty 1

## 2017-08-11 MED ORDER — ONDANSETRON HCL 4 MG/2ML IJ SOLN: 4.0000 mg | Freq: Four times a day (QID) | INTRAMUSCULAR | Status: DC | PRN

## 2017-08-11 MED ORDER — BUPIVACAINE-EPINEPHRINE 0.25% -1:200000 IJ SOLN
INTRAMUSCULAR | Status: DC | PRN
  Administered 2017-08-11: 30 mL

## 2017-08-11 MED ORDER — ACETAMINOPHEN 500 MG PO TABS
ORAL_TABLET | ORAL | Status: AC
  Filled 2017-08-11: qty 2

## 2017-08-11 MED ORDER — OXYCODONE HCL 5 MG PO TABS: 5.0000 mg | ORAL_TABLET | ORAL | Status: DC | PRN

## 2017-08-11 MED ORDER — ONDANSETRON HCL 4 MG/2ML IJ SOLN
INTRAMUSCULAR | Status: DC | PRN
  Administered 2017-08-11: 4 mg via INTRAVENOUS

## 2017-08-11 MED ORDER — MIDAZOLAM HCL 2 MG/2ML IJ SOLN
INTRAMUSCULAR | Status: AC
  Filled 2017-08-11: qty 2

## 2017-08-11 MED ORDER — SODIUM CHLORIDE 0.9 % IR SOLN
Status: DC | PRN
  Administered 2017-08-11: 3000 mL

## 2017-08-11 MED ORDER — MIDAZOLAM HCL 5 MG/5ML IJ SOLN
INTRAMUSCULAR | Status: DC | PRN
  Administered 2017-08-11 (×2): 1 mg via INTRAVENOUS

## 2017-08-11 MED ORDER — POLYETHYLENE GLYCOL 3350 17 G PO PACK
17.0000 g | PACK | Freq: Two times a day (BID) | ORAL | Status: DC
  Administered 2017-08-12 – 2017-08-13 (×3): 17 g via ORAL
  Filled 2017-08-11 (×3): qty 1

## 2017-08-11 MED ORDER — CEFAZOLIN SODIUM-DEXTROSE 2-4 GM/100ML-% IV SOLN
INTRAVENOUS | Status: AC
  Filled 2017-08-11: qty 100

## 2017-08-11 MED ORDER — FENTANYL CITRATE (PF) 250 MCG/5ML IJ SOLN
INTRAMUSCULAR | Status: AC
  Filled 2017-08-11: qty 5

## 2017-08-11 MED ORDER — ACETAMINOPHEN 650 MG RE SUPP: 650.0000 mg | RECTAL | Status: DC | PRN

## 2017-08-11 MED ORDER — HYDROMORPHONE HCL 1 MG/ML IJ SOLN: 1.0000 mg | INTRAMUSCULAR | Status: DC | PRN

## 2017-08-11 MED ORDER — POTASSIUM CHLORIDE IN NACL 20-0.9 MEQ/L-% IV SOLN
INTRAVENOUS | Status: DC
  Administered 2017-08-11 (×2): via INTRAVENOUS
  Filled 2017-08-11: qty 1000

## 2017-08-11 MED ORDER — OXYCODONE HCL 5 MG PO TABS
10.0000 mg | ORAL_TABLET | ORAL | Status: DC | PRN
  Administered 2017-08-11 – 2017-08-13 (×12): 10 mg via ORAL
  Filled 2017-08-11 (×11): qty 2

## 2017-08-11 MED ORDER — VANCOMYCIN HCL IN DEXTROSE 1-5 GM/200ML-% IV SOLN
1000.0000 mg | Freq: Two times a day (BID) | INTRAVENOUS | Status: AC
  Administered 2017-08-11: 1000 mg via INTRAVENOUS
  Filled 2017-08-11: qty 200

## 2017-08-11 MED ORDER — METOCLOPRAMIDE HCL 5 MG/ML IJ SOLN: 5.0000 mg | Freq: Three times a day (TID) | INTRAMUSCULAR | Status: DC | PRN

## 2017-08-11 MED ORDER — METOCLOPRAMIDE HCL 5 MG PO TABS: 5.0000 mg | ORAL_TABLET | Freq: Three times a day (TID) | ORAL | Status: DC | PRN

## 2017-08-11 MED ORDER — DOCUSATE SODIUM 100 MG PO CAPS
100.0000 mg | ORAL_CAPSULE | Freq: Two times a day (BID) | ORAL | Status: DC
  Administered 2017-08-11 – 2017-08-13 (×4): 100 mg via ORAL
  Filled 2017-08-11 (×4): qty 1

## 2017-08-11 MED ORDER — DEXAMETHASONE SODIUM PHOSPHATE 10 MG/ML IJ SOLN
10.0000 mg | Freq: Three times a day (TID) | INTRAMUSCULAR | Status: AC
  Administered 2017-08-11 – 2017-08-12 (×4): 10 mg via INTRAVENOUS
  Filled 2017-08-11 (×4): qty 1

## 2017-08-11 MED ORDER — OXYCODONE HCL 5 MG/5ML PO SOLN: 5.0000 mg | Freq: Once | ORAL | Status: DC | PRN

## 2017-08-11 MED ORDER — ATORVASTATIN CALCIUM 20 MG PO TABS
20.0000 mg | ORAL_TABLET | Freq: Every day | ORAL | Status: DC
  Administered 2017-08-11 – 2017-08-12 (×2): 20 mg via ORAL
  Filled 2017-08-11 (×2): qty 1

## 2017-08-11 MED ORDER — DEXAMETHASONE SODIUM PHOSPHATE 10 MG/ML IJ SOLN
INTRAMUSCULAR | Status: DC | PRN
  Administered 2017-08-11: 10 mg via INTRAVENOUS

## 2017-08-11 MED ORDER — POVIDONE-IODINE 7.5 % EX SOLN
Freq: Once | CUTANEOUS | Status: DC
  Filled 2017-08-11: qty 118

## 2017-08-11 MED ORDER — OXYCODONE HCL 5 MG PO TABS
ORAL_TABLET | ORAL | Status: AC
  Administered 2017-08-11: 10 mg via ORAL
  Filled 2017-08-11: qty 2

## 2017-08-11 MED ORDER — LACTATED RINGERS IV SOLN
INTRAVENOUS | Status: DC | PRN
  Administered 2017-08-11 (×2): via INTRAVENOUS

## 2017-08-11 MED ORDER — ASPIRIN EC 325 MG PO TBEC
325.0000 mg | DELAYED_RELEASE_TABLET | Freq: Every day | ORAL | Status: DC
  Administered 2017-08-12 – 2017-08-13 (×2): 325 mg via ORAL
  Filled 2017-08-11 (×2): qty 1

## 2017-08-11 MED ORDER — BUPIVACAINE IN DEXTROSE 0.75-8.25 % IT SOLN
INTRATHECAL | Status: DC | PRN
  Administered 2017-08-11: 2 mL via INTRATHECAL

## 2017-08-11 MED ORDER — BUPIVACAINE-EPINEPHRINE (PF) 0.25% -1:200000 IJ SOLN
INTRAMUSCULAR | Status: AC
  Filled 2017-08-11: qty 30

## 2017-08-11 MED ORDER — ROPIVACAINE HCL 7.5 MG/ML IJ SOLN
INTRAMUSCULAR | Status: DC | PRN
  Administered 2017-08-11: 20 mL via PERINEURAL

## 2017-08-11 MED ORDER — CEFAZOLIN SODIUM-DEXTROSE 2-4 GM/100ML-% IV SOLN
2.0000 g | INTRAVENOUS | Status: AC
  Administered 2017-08-11: 2 g via INTRAVENOUS

## 2017-08-11 MED ORDER — OXYCODONE HCL 5 MG PO TABS: 5.0000 mg | ORAL_TABLET | Freq: Once | ORAL | Status: DC | PRN

## 2017-08-11 MED ORDER — PHENOL 1.4 % MT LIQD: 1.0000 | OROMUCOSAL | Status: DC | PRN

## 2017-08-11 MED ORDER — DIPHENHYDRAMINE HCL 12.5 MG/5ML PO ELIX: 12.5000 mg | ORAL_SOLUTION | ORAL | Status: DC | PRN

## 2017-08-11 MED ORDER — ALUM & MAG HYDROXIDE-SIMETH 200-200-20 MG/5ML PO SUSP: 30.0000 mL | ORAL | Status: DC | PRN

## 2017-08-11 MED ORDER — FENTANYL CITRATE (PF) 100 MCG/2ML IJ SOLN
INTRAMUSCULAR | Status: DC | PRN
  Administered 2017-08-11: 50 ug via INTRAVENOUS

## 2017-08-11 MED ORDER — ACETAMINOPHEN 325 MG PO TABS
650.0000 mg | ORAL_TABLET | ORAL | Status: DC | PRN
  Administered 2017-08-12 (×2): 650 mg via ORAL
  Filled 2017-08-11 (×3): qty 2

## 2017-08-11 SURGICAL SUPPLY — 103 items
APL SKNCLS STERI-STRIP NONHPOA (GAUZE/BANDAGES/DRESSINGS) ×1
BANDAGE ACE 4X5 VEL STRL LF (GAUZE/BANDAGES/DRESSINGS) ×3 IMPLANT
BANDAGE ACE 6X5 VEL STRL LF (GAUZE/BANDAGES/DRESSINGS) ×3 IMPLANT
BANDAGE ELASTIC 6 VELCRO ST LF (GAUZE/BANDAGES/DRESSINGS) ×2 IMPLANT
BANDAGE ESMARK 6X9 LF (GAUZE/BANDAGES/DRESSINGS) ×1 IMPLANT
BENZOIN TINCTURE PRP APPL 2/3 (GAUZE/BANDAGES/DRESSINGS) ×3 IMPLANT
BLADE SAGITTAL 25.0X1.19X90 (BLADE) ×2 IMPLANT
BLADE SAGITTAL 25.0X1.19X90MM (BLADE) ×1
BLADE SAW SGTL 13X75X1.27 (BLADE) ×3 IMPLANT
BLADE SURG 10 STRL SS (BLADE) ×4 IMPLANT
BNDG CMPR 9X6 STRL LF SNTH (GAUZE/BANDAGES/DRESSINGS) ×1
BNDG CMPR MED 15X6 ELC VLCR LF (GAUZE/BANDAGES/DRESSINGS) ×1
BNDG ELASTIC 6X15 VLCR STRL LF (GAUZE/BANDAGES/DRESSINGS) ×3 IMPLANT
BNDG ESMARK 6X9 LF (GAUZE/BANDAGES/DRESSINGS) ×3
BNDG GAUZE ELAST 4 BULKY (GAUZE/BANDAGES/DRESSINGS) ×1 IMPLANT
BOWL SMART MIX CTS (DISPOSABLE) ×3 IMPLANT
CAPT KNEE TOTAL 3 ATTUNE ×2 IMPLANT
CEMENT HV SMART SET (Cement) ×6 IMPLANT
CLOSURE STERI-STRIP 1/2X4 (GAUZE/BANDAGES/DRESSINGS) ×1
CLOSURE WOUND 1/2 X4 (GAUZE/BANDAGES/DRESSINGS) ×1
CLSR STERI-STRIP ANTIMIC 1/2X4 (GAUZE/BANDAGES/DRESSINGS) ×1 IMPLANT
COVER MAYO STAND STRL (DRAPES) ×3 IMPLANT
COVER SURGICAL LIGHT HANDLE (MISCELLANEOUS) ×3 IMPLANT
CUFF TOURNIQUET SINGLE 18IN (TOURNIQUET CUFF) IMPLANT
CUFF TOURNIQUET SINGLE 24IN (TOURNIQUET CUFF) IMPLANT
CUFF TOURNIQUET SINGLE 34IN LL (TOURNIQUET CUFF) ×3 IMPLANT
CUFF TOURNIQUET SINGLE 44IN (TOURNIQUET CUFF) IMPLANT
DECANTER SPIKE VIAL GLASS SM (MISCELLANEOUS) ×1 IMPLANT
DRAPE C-ARM 42X72 X-RAY (DRAPES) IMPLANT
DRAPE EXTREMITY T 121X128X90 (DRAPE) ×3 IMPLANT
DRAPE HALF SHEET 40X57 (DRAPES) ×4 IMPLANT
DRAPE INCISE IOBAN 66X45 STRL (DRAPES) IMPLANT
DRAPE OEC MINIVIEW 54X84 (DRAPES) IMPLANT
DRAPE ORTHO SPLIT 77X108 STRL (DRAPES) ×3
DRAPE SURG ORHT 6 SPLT 77X108 (DRAPES) ×1 IMPLANT
DRAPE U-SHAPE 47X51 STRL (DRAPES) ×3 IMPLANT
DRSG AQUACEL AG ADV 3.5X10 (GAUZE/BANDAGES/DRESSINGS) ×2 IMPLANT
DRSG AQUACEL AG ADV 3.5X14 (GAUZE/BANDAGES/DRESSINGS) ×3 IMPLANT
DURAPREP 26ML APPLICATOR (WOUND CARE) ×4 IMPLANT
ELECT CAUTERY BLADE 6.4 (BLADE) ×3 IMPLANT
ELECT REM PT RETURN 9FT ADLT (ELECTROSURGICAL) ×3
ELECTRODE REM PT RTRN 9FT ADLT (ELECTROSURGICAL) ×1 IMPLANT
FACESHIELD WRAPAROUND (MASK) ×6 IMPLANT
FACESHIELD WRAPAROUND OR TEAM (MASK) ×1 IMPLANT
GAUZE SPONGE 4X4 12PLY STRL (GAUZE/BANDAGES/DRESSINGS) ×1 IMPLANT
GAUZE XEROFORM 1X8 LF (GAUZE/BANDAGES/DRESSINGS) ×2 IMPLANT
GAUZE XEROFORM 5X9 LF (GAUZE/BANDAGES/DRESSINGS) ×1 IMPLANT
GLOVE BIO SURGEON STRL SZ7 (GLOVE) ×3 IMPLANT
GLOVE BIOGEL PI IND STRL 7.0 (GLOVE) ×1 IMPLANT
GLOVE BIOGEL PI IND STRL 7.5 (GLOVE) ×1 IMPLANT
GLOVE BIOGEL PI INDICATOR 7.0 (GLOVE) ×2
GLOVE BIOGEL PI INDICATOR 7.5 (GLOVE) ×2
GLOVE SS BIOGEL STRL SZ 7.5 (GLOVE) ×1 IMPLANT
GLOVE SUPERSENSE BIOGEL SZ 7.5 (GLOVE) ×2
GOWN STRL REUS W/ TWL LRG LVL3 (GOWN DISPOSABLE) ×3 IMPLANT
GOWN STRL REUS W/ TWL XL LVL3 (GOWN DISPOSABLE) ×2 IMPLANT
GOWN STRL REUS W/TWL LRG LVL3 (GOWN DISPOSABLE) ×9
GOWN STRL REUS W/TWL XL LVL3 (GOWN DISPOSABLE) ×3
HANDPIECE INTERPULSE COAX TIP (DISPOSABLE) ×3
HOOD PEEL AWAY FACE SHEILD DIS (HOOD) ×6 IMPLANT
IMMOBILIZER KNEE 22 (SOFTGOODS) ×2 IMPLANT
IMMOBILIZER KNEE 22 UNIV (SOFTGOODS) ×3 IMPLANT
KIT BASIN OR (CUSTOM PROCEDURE TRAY) ×3 IMPLANT
KIT ROOM TURNOVER OR (KITS) ×3 IMPLANT
MANIFOLD NEPTUNE II (INSTRUMENTS) ×3 IMPLANT
MARKER SKIN DUAL TIP RULER LAB (MISCELLANEOUS) ×3 IMPLANT
NDL 18GX1X1/2 (RX/OR ONLY) (NEEDLE) ×1 IMPLANT
NDL HYPO 21X1.5 SAFETY (NEEDLE) IMPLANT
NEEDLE 18GX1X1/2 (RX/OR ONLY) (NEEDLE) ×3 IMPLANT
NEEDLE HYPO 21X1.5 SAFETY (NEEDLE) IMPLANT
NS IRRIG 1000ML POUR BTL (IV SOLUTION) ×3 IMPLANT
PACK ORTHO EXTREMITY (CUSTOM PROCEDURE TRAY) ×3 IMPLANT
PACK TOTAL JOINT (CUSTOM PROCEDURE TRAY) ×3 IMPLANT
PAD ARMBOARD 7.5X6 YLW CONV (MISCELLANEOUS) ×4 IMPLANT
PAD CAST 4YDX4 CTTN HI CHSV (CAST SUPPLIES) ×2 IMPLANT
PADDING CAST COTTON 4X4 STRL (CAST SUPPLIES) ×6
PADDING CAST COTTON 6X4 STRL (CAST SUPPLIES) ×3 IMPLANT
SET HNDPC FAN SPRY TIP SCT (DISPOSABLE) ×1 IMPLANT
SPONGE LAP 4X18 X RAY DECT (DISPOSABLE) ×2 IMPLANT
STAPLER VISISTAT 35W (STAPLE) ×1 IMPLANT
STRIP CLOSURE SKIN 1/2X4 (GAUZE/BANDAGES/DRESSINGS) ×2 IMPLANT
SUCTION FRAZIER HANDLE 10FR (MISCELLANEOUS) ×2
SUCTION TUBE FRAZIER 10FR DISP (MISCELLANEOUS) ×1 IMPLANT
SUT MNCRL AB 3-0 PS2 18 (SUTURE) ×3 IMPLANT
SUT VIC AB 0 CT1 27 (SUTURE) ×6
SUT VIC AB 0 CT1 27XBRD ANBCTR (SUTURE) ×2 IMPLANT
SUT VIC AB 1 CT1 27 (SUTURE) ×6
SUT VIC AB 1 CT1 27XBRD ANBCTR (SUTURE) ×1 IMPLANT
SUT VIC AB 1 CT1 27XBRD ANTBC (SUTURE) ×1 IMPLANT
SUT VIC AB 2-0 CT1 27 (SUTURE) ×6
SUT VIC AB 2-0 CT1 TAPERPNT 27 (SUTURE) ×2 IMPLANT
SUT VIC AB 2-0 FS1 27 (SUTURE) ×3 IMPLANT
SYR 30ML LL (SYRINGE) ×3 IMPLANT
SYR CONTROL 10ML LL (SYRINGE) IMPLANT
TOWEL OR 17X24 6PK STRL BLUE (TOWEL DISPOSABLE) ×3 IMPLANT
TOWEL OR 17X26 10 PK STRL BLUE (TOWEL DISPOSABLE) ×3 IMPLANT
TRAY CATH 16FR W/PLASTIC CATH (SET/KITS/TRAYS/PACK) IMPLANT
TRAY FOLEY CATH SILVER 16FR (SET/KITS/TRAYS/PACK) ×3 IMPLANT
TUBE CONNECTING 12'X1/4 (SUCTIONS) ×1
TUBE CONNECTING 12X1/4 (SUCTIONS) ×2 IMPLANT
UNDERPAD 30X30 (UNDERPADS AND DIAPERS) ×1 IMPLANT
WATER STERILE IRR 1000ML POUR (IV SOLUTION) ×1 IMPLANT
YANKAUER SUCT BULB TIP NO VENT (SUCTIONS) ×3 IMPLANT

## 2017-08-11 NOTE — Anesthesia Preprocedure Evaluation (Signed)
Anesthesia Evaluation  Patient identified by MRN, date of birth, ID band Patient awake    Reviewed: Allergy & Precautions, NPO status , Patient's Chart, lab work & pertinent test results  History of Anesthesia Complications Negative for: history of anesthetic complications  Airway Mallampati: II  TM Distance: >3 FB Neck ROM: Full    Dental  (+) Teeth Intact   Pulmonary neg pulmonary ROS,    breath sounds clear to auscultation       Cardiovascular hypertension, Pt. on medications (-) angina(-) CHF  Rhythm:Regular     Neuro/Psych negative neurological ROS  negative psych ROS   GI/Hepatic Neg liver ROS, GERD  Medicated,  Endo/Other  negative endocrine ROS  Renal/GU negative Renal ROS     Musculoskeletal  (+) Arthritis ,   Abdominal   Peds  Hematology negative hematology ROS (+)   Anesthesia Other Findings   Reproductive/Obstetrics                             Anesthesia Physical Anesthesia Plan  ASA: II  Anesthesia Plan: MAC, Regional and Spinal   Post-op Pain Management:    Induction:   PONV Risk Score and Plan: 1  Airway Management Planned: Nasal Cannula  Additional Equipment: None  Intra-op Plan:   Post-operative Plan:   Informed Consent: I have reviewed the patients History and Physical, chart, labs and discussed the procedure including the risks, benefits and alternatives for the proposed anesthesia with the patient or authorized representative who has indicated his/her understanding and acceptance.   Dental advisory given  Plan Discussed with: CRNA and Surgeon  Anesthesia Plan Comments:         Anesthesia Quick Evaluation

## 2017-08-11 NOTE — Progress Notes (Signed)
Orthopedic Tech Progress Note Patient Details:  Eric Dominguez 10-03-1945 [id number]  CPM Right Knee CPM Right Knee: On Right Knee Flexion (Degrees): 90 Right Knee Extension (Degrees): 0  Post Interventions Patient Tolerated: Well Instructions Provided: Care of device  [name] 08/11/2017, 10:56 AM ohf  Not applied because new frames for new hospital beds have not yet arrived; RN notified

## 2017-08-11 NOTE — Evaluation (Signed)
Physical Therapy Evaluation Patient Details Name: Eric Dominguez MRN: [id number] DOB: Jan 07, 1946 Today's Date: 08/11/2017   History of Present Illness  71 y.o. male s/p R TKA 12/3. PMH includes:  first degree heart block, prostate cancer, HTN, GERD,   Clinical Impression  Patient is s/p above surgery resulting in functional limitations due to the deficits listed below (see PT Problem List). PTA, pt was mod I with all mobility. Patient lives with wife who is available 24/7 for support, with a ramped entrance to enter home. Upon eval, patient presenting with post op pain and weakness limiting his mobility. Pt is min A with transfers and min guard with gait for short distances. Session focused on therex and introducing ambulation outside hospital room. Next visit will focus on progressing activity tolerance and improving gait mechanics.  Patient will benefit from skilled PT to increase their independence and safety with mobility to allow discharge to the venue listed below.       Follow Up Recommendations Home health PT;Supervision for mobility/OOB;DC plan and follow up therapy as arranged by surgeon    Equipment Recommendations  None recommended by PT    Recommendations for Other Services       Precautions / Restrictions Precautions Precautions: Knee;Fall Precaution Booklet Issued: Yes (comment) Precaution Comments: reviewed no pillow below knee, knee extension, and supine therex Restrictions Weight Bearing Restrictions: Yes RLE Weight Bearing: Weight bearing as tolerated      Mobility  Bed Mobility Overal bed mobility: Needs Assistance Bed Mobility: Sit to Supine;Supine to Sit     Supine to sit: Supervision Sit to supine: Supervision   General bed mobility comments: supine<>sit without physical assistance  Transfers Overall transfer level: Needs assistance Equipment used: Rolling walker (2 wheeled) Transfers: Sit to/from Stand Sit to Stand: Min assist         General  transfer comment: Min A to power up into RW, cues for hand placement and safety  Ambulation/Gait Ambulation/Gait assistance: Min guard Ambulation Distance (Feet): 60 Feet Assistive device: Rolling walker (2 wheeled) Gait Pattern/deviations: Step-to pattern;Antalgic;Decreased stance time - right Gait velocity: decreased   General Gait Details: [employer], cues for sequencing.   Stairs            Wheelchair Mobility    Modified Rankin (Stroke Patients Only)       Balance Overall balance assessment: Needs assistance Sitting-balance support: Feet unsupported;No upper extremity supported Sitting balance-Leahy Scale: Normal     Standing balance support: During functional activity;Bilateral upper extremity supported Standing balance-Leahy Scale: Fair                               Pertinent Vitals/Pain Pain Assessment: 0-10 Pain Score: 3  Pain Descriptors / Indicators: Operative site guarding;Grimacing;Aching;Burning Pain Intervention(s): Limited activity within patient's tolerance;Monitored during session;Premedicated before session    Home Living Family/patient expects to be discharged to:: Private residence Living Arrangements: Spouse/significant other Available Help at Discharge: Family;Available 24 hours/day Type of Home: House Home Access: Stairs to enter;Ramped entrance Entrance Stairs-Rails: Can reach both Entrance Stairs-Number of Steps: 8 Home Layout: Two level;Able to live on main level with bedroom/bathroom Home Equipment: Gilford Rile - 2 wheels      Prior Function Level of Independence: Independent               Hand Dominance        Extremity/Trunk Assessment        Lower Extremity Assessment Lower Extremity  Assessment: (RLE strength 3/5, LLE 4/5)       Communication   Communication: No difficulties  Cognition Arousal/Alertness: Awake/alert Behavior During Therapy: WFL for tasks assessed/performed Overall Cognitive Status:  Within Functional Limits for tasks assessed                                        General Comments General comments (skin integrity, edema, etc.): VSS throughout session    Exercises General Exercises - Lower Extremity Ankle Circles/Pumps: AROM;Both;10 reps Quad Sets: AROM;Both;10 reps Long Arc Quad: AROM;Right;10 reps Heel Slides: AROM;15 reps;Right Hip ABduction/ADduction: AROM;Both;10 reps   Assessment/Plan    PT Assessment Patient needs continued PT services  PT Problem List Decreased strength;Decreased range of motion;Decreased activity tolerance;Decreased balance;Decreased knowledge of use of DME;Pain;Decreased mobility       PT Treatment Interventions DME instruction;Gait training;Stair training;Functional mobility training;Therapeutic activities;Therapeutic exercise;Balance training    PT Goals (Current goals can be found in the Care Plan section)  Acute Rehab PT Goals Patient Stated Goal: return home with HHPT PT Goal Formulation: With patient Time For Goal Achievement: 08/18/17 Potential to Achieve Goals: Good    Frequency 7X/week   Barriers to discharge        Co-evaluation               AM-PAC PT "6 Clicks" Daily Activity  Outcome Measure Difficulty turning over in bed (including adjusting bedclothes, sheets and blankets)?: A Little Difficulty moving from lying on back to sitting on the side of the bed? : A Little Difficulty sitting down on and standing up from a chair with arms (e.g., wheelchair, bedside commode, etc,.)?: A Little Help needed moving to and from a bed to chair (including a wheelchair)?: A Little Help needed walking in hospital room?: A Little Help needed climbing 3-5 steps with a railing? : A Lot 6 Click Score: 17    End of Session Equipment Utilized During Treatment: Gait belt Activity Tolerance: Patient tolerated treatment well Patient left: in bed;with call bell/phone within reach;with nursing/sitter in  room Nurse Communication: Mobility status PT Visit Diagnosis: Unsteadiness on feet (R26.81);Other abnormalities of gait and mobility (R26.89);Repeated falls (R29.6);Muscle weakness (generalized) (M62.81);Pain Pain - Right/Left: Right Pain - part of body: Knee    Time: 1650-1737 PT Time Calculation (min) (ACUTE ONLY): 47 min   Charges:   PT Evaluation $PT Eval Low Complexity: 1 Low PT Treatments $Gait Training: 8-22 mins $Therapeutic Exercise: 8-22 mins   PT G Codes:        Reinaldo Berber, PT, DPT Acute Rehab Services Pager: [phone]    Reinaldo Berber 08/11/2017, 5:50 PM

## 2017-08-11 NOTE — Interval H&P Note (Signed)
History and Physical Interval Note:  08/11/2017 7:09 AM  [name]  has presented today for surgery, with the diagnosis of djd right knee  The various methods of treatment have been discussed with the patient and family. After consideration of risks, benefits and other options for treatment, the patient has consented to  Procedure(s): RIGHT TOTAL KNEE ARTHROPLASTY (Right) HARDWARE REMOVAL RIGHT KNEE (Right) as a surgical intervention .  The patient's history has been reviewed, patient examined, no change in status, stable for surgery.  I have reviewed the patient's chart and labs.  Questions were answered to the patient's satisfaction.     Eric Dominguez

## 2017-08-11 NOTE — Anesthesia Procedure Notes (Signed)
Procedure Name: MAC Date/Time: 08/11/2017 7:22 AM Performed by: Glynda Jaeger, CRNA Pre-anesthesia Checklist: Patient identified, Emergency Drugs available, Suction available, Timeout performed and Patient being monitored Patient Re-evaluated:Patient Re-evaluated prior to induction Oxygen Delivery Method: Simple face mask Placement Confirmation: positive ETCO2

## 2017-08-11 NOTE — Transfer of Care (Signed)
Immediate Anesthesia Transfer of Care Note  Patient: Eric Dominguez  Procedure(s) Performed: RIGHT TOTAL KNEE ARTHROPLASTY (Right Knee) HARDWARE REMOVAL RIGHT KNEE (Right Knee)  Patient Location: PACU  Anesthesia Type:MAC  Level of Consciousness: awake, alert , oriented, patient cooperative and responds to stimulation  Airway & Oxygen Therapy: Patient Spontanous Breathing and Patient connected to face mask oxygen  Post-op Assessment: Report given to RN and Post -op Vital signs reviewed and stable  Post vital signs: Reviewed and stable  Last Vitals:  Vitals:   07/29/17 1400 08/11/17 0612  BP: 113/70 (!) 144/79  Pulse: 77 66  Resp:  18  Temp: (!) 36.3 C 36.7 C  SpO2: 97% 97%    Last Pain:  Vitals:   08/11/17 0618  TempSrc:   PainSc: 5       Patients Stated Pain Goal: 4 (48/25/00 3704)  Complications: No apparent anesthesia complications

## 2017-08-11 NOTE — Anesthesia Procedure Notes (Signed)
Anesthesia Regional Block: Adductor canal block   Pre-Anesthetic Checklist: ,, timeout performed, Correct Patient, Correct Site, Correct Laterality, Correct Procedure, Correct Position, site marked, Risks and benefits discussed,  Surgical consent,  Pre-op evaluation,  At surgeon's request and post-op pain management  Laterality: Lower and Right  Prep: chloraprep       Needles:  Injection technique: Single-shot  Needle Type: Echogenic Stimulator Needle          Additional Needles:   Procedures:,,,, ultrasound used (permanent image in chart),,,,  Narrative:  Start time: 08/11/2017 7:12 AM End time: 08/11/2017 7:15 AM Injection made incrementally with aspirations every 5 mL.  Performed by: Personally  Anesthesiologist: Oleta Mouse, MD  Additional Notes: H+P and labs reviewed, risks and benefits discussed with patient, procedure tolerated well without complications

## 2017-08-11 NOTE — Op Note (Signed)
MRN:     [id number] DOB/AGE:    01/17/1946 / 71 y.o.       OPERATIVE REPORT    DATE OF PROCEDURE:  08/11/2017       PREOPERATIVE DIAGNOSIS:   Primary localized OA right knee ,    Retained hardware Rt knee     Estimated body mass index is 28.46 kg/m as calculated from the following:   Height as of this encounter: 6' (1.829 m).   Weight as of this encounter: 95.2 kg (209 lb 13 oz).                                                        POSTOPERATIVE DIAGNOSIS:   same                                                                      PROCEDURE:  Procedure(s): RIGHT TOTAL KNEE ARTHROPLASTY HARDWARE REMOVAL RIGHT KNEE Using Depuy Attune RP implants #7 Femur, #8Tibia, 87mm  RP bearing, 35 Patella     SURGEON: Lorn Junes    ASSISTANT:  Kirstin Shepperson PA-C   (Present and scrubbed throughout the case, critical for assistance with exposure, retraction, instrumentation, and closure.)         ANESTHESIA: Spinal with Adductor Nerve Block     TOURNIQUET TIME: 41LKG   COMPLICATIONS:  None     SPECIMENS: None   INDICATIONS FOR PROCEDURE: The patient has  djd right knee, varus deformities, XR shows bone on bone arthritis. Patient has failed all conservative measures including anti-inflammatory medicines, narcotics, attempts at  exercise and weight loss, cortisone injections and viscosupplementation.  Risks and benefits of surgery have been discussed, questions answered.   DESCRIPTION OF PROCEDURE: The patient identified by armband, received  right femoral nerve block and IV antibiotics, in the holding area at [facility]. Patient taken to the operating room, appropriate anesthetic  monitors were attached Spinal anesthesia induced with  the patient in supine position, Foley catheter was inserted. Tourniquet  applied high to the operative thigh. Lateral post and foot positioner  applied to the table, the lower extremity was then prepped and draped  in usual sterile fashion from  the ankle to the tourniquet. Time-out procedure was performed. The limb was wrapped with an Esmarch bandage and the tourniquet inflated to 365 mmHg. We began the operation by making the anterior midline incision starting at handbreadth above the patella going over the patella 1 cm medial to and  4 cm distal to the tibial tubercle. Small bleeders in the skin and the  subcutaneous tissue identified and cauterized. Transverse retinaculum was incised and reflected medially and a medial parapatellar arthrotomy was accomplished. the patella was everted and theprepatellar fat pad resected. The superficial medial collateral  ligament was then elevated from anterior to posterior along the proximal  flare of the tibia and anterior half of the menisci resected. The knee was hyperflexed exposing bone on bone arthritis. Peripheral and notch osteophytes as well as the cruciate ligaments were then resected. We continued to  work our way around  posteriorly along the proximal tibia, and externally  rotated the tibia subluxing it out from underneath the femur. The ACL screw in the tibia was then carrefully removed from the tibia . A McHale  retractor was placed through the notch and a lateral Hohmann retractor  placed, and we then drilled through the proximal tibia in line with the  axis of the tibia followed by an intramedullary guide rod and 2-degree  posterior slope cutting guide. The tibial cutting guide was pinned into place  allowing resection of 6 mm of bone medially and about 4 mm of bone  laterally because of her vallgus deformity. Satisfied with the tibial resection, we then  entered the distal femur 2 mm anterior to the PCL origin with the  intramedullary guide rod and applied the distal femoral cutting guide  set at 49mm, with 5 degrees of valgus. This was pinned along the  epicondylar axis. At this point, the distal femoral cut was accomplished without difficulty. The femoral ACL screw was then carefully  removed from the femur  We then sized for a #7 femoral component and pinned the guide in 3 degrees of external rotation.The chamfer cutting guide was pinned into place. The anterior, posterior, and chamfer cuts were accomplished without difficulty followed by  the  RP box cutting guide and the box cut. We also removed posterior osteophytes from the posterior femoral condyles. At this  time, the knee was brought into full extension. We checked our  extension and flexion gaps and found them symmetric at 54mm.  The patella thickness measured at 25 mm. We set the cutting guide at 15 and removed the posterior 9.5-10 mm  of the patella sized for 35 button and drilled the lollipop. The knee  was then once again hyperflexed exposing the proximal tibia. We sized for a #8 tibial base plate, applied the smokestack and the conical reamer followed by the the Delta fin keel punch. We then hammered into place the  RP trial femoral component, inserted a 1 trial bearing, trial patellar button, and took the knee through range of motion from 0-130 degrees. No thumb pressure was required for patellar  tracking. At this point, all trial components were removed, a double batch of DePuy HV cement  was mixed and applied to all bony metallic mating surfaces except for the posterior condyles of the femur itself. In order, we  hammered into place the tibial tray and removed excess cement, the femoral component and removed excess cement, a 105mm  RP bearing  was inserted, and the knee brought to full extension with compression.  The patellar button was clamped into place, and excess cement  removed. While the cement cured the wound was irrigated out with normal saline solution pulse lavage.. Ligament stability and patellar tracking were checked and found to be excellent.. The parapatellar arthrotomy was closed with  #1 Vicryl suture. The subcutaneous tissue with 0 and 2-0 undyed  Vicryl suture, and 4-0 Monocryl.. A dressing of  Aquaseal,  4 x 4, dressing sponges, Webril, and Ace wrap applied. Needle and sponge count were correct times 2.The patient awakened, extubated, and taken to recovery room without difficulty. Vascular status was normal, pulses 2+ and symmetric.   Lorn Junes 08/11/2017, 9:31 AM

## 2017-08-11 NOTE — Anesthesia Procedure Notes (Signed)
Spinal  Patient location during procedure: OR Start time: 08/11/2017 7:24 AM End time: 08/11/2017 7:27 AM Staffing Anesthesiologist: Oleta Mouse, MD Preanesthetic Checklist Completed: patient identified, surgical consent, pre-op evaluation, timeout performed, IV checked, risks and benefits discussed and monitors and equipment checked Spinal Block Patient position: sitting Prep: site prepped and draped and DuraPrep Patient monitoring: heart rate, cardiac monitor, continuous pulse ox and blood pressure Approach: midline Location: L5-S1 Injection technique: single-shot Needle Needle type: Pencan  Needle gauge: 24 G Needle length: 10 cm Assessment Sensory level: T6

## 2017-08-11 NOTE — Plan of Care (Signed)
  Progressing Education: Knowledge of General Education information will improve 08/11/2017 1258 - Progressing by Harlin Heys, RN Health Behavior/Discharge Planning: Ability to manage health-related needs will improve 08/11/2017 1258 - Progressing by Harlin Heys, RN Clinical Measurements: Ability to maintain clinical measurements within normal limits will improve 08/11/2017 1258 - Progressing by Harlin Heys, RN Will remain free from infection 08/11/2017 1258 - Progressing by Harlin Heys, RN Diagnostic test results will improve 08/11/2017 1258 - Progressing by Harlin Heys, RN Respiratory complications will improve 08/11/2017 1258 - Progressing by Harlin Heys, RN Cardiovascular complication will be avoided 08/11/2017 1258 - Progressing by Harlin Heys, RN Activity: Risk for activity intolerance will decrease 08/11/2017 1258 - Progressing by Harlin Heys, RN Nutrition: Adequate nutrition will be maintained 08/11/2017 1258 - Progressing by Harlin Heys, RN Coping: Level of anxiety will decrease 08/11/2017 1258 - Progressing by Harlin Heys, RN Elimination: Will not experience complications related to bowel motility 08/11/2017 1258 - Progressing by Harlin Heys, RN Will not experience complications related to urinary retention 08/11/2017 1258 - Progressing by Harlin Heys, RN Pain Managment: General experience of comfort will improve 08/11/2017 1258 - Progressing by Harlin Heys, RN Safety: Ability to remain free from injury will improve 08/11/2017 1258 - Progressing by Harlin Heys, RN Skin Integrity: Risk for impaired skin integrity will decrease 08/11/2017 1258 - Progressing by Harlin Heys, RN Education: Knowledge of the prescribed therapeutic regimen will improve 08/11/2017 1258 - Progressing by Harlin Heys, RN Activity: Ability to avoid complications of mobility impairment will improve 08/11/2017 1258 - Progressing by Harlin Heys, RN Range of joint motion will improve 08/11/2017 1258 - Progressing by Harlin Heys, RN Clinical Measurements: Postoperative complications will be avoided or minimized 08/11/2017 1258 - Progressing by Harlin Heys, RN Pain Management: Pain level will decrease with appropriate interventions 08/11/2017 1258 - Progressing by Harlin Heys, RN Skin Integrity: Signs of wound healing will improve 08/11/2017 1258 - Progressing by Harlin Heys, RN

## 2017-08-11 NOTE — Care Management Note (Signed)
Case Management Note  Patient Details  Name: Eric Dominguez MRN: [id number] Date of Birth: 1946/06/06  Subjective/Objective:   Right TKA                 Action/Plan: Discharge Planning: NCM spoke to pt and has DME, RW, 3n1 and CPM at home. Offered choice for HH/list provided. Will follow up on agency that was preoperatively arranged.   PCP Marton Redwood MD  Expected Discharge Date:                  Expected Discharge Plan:  [place]  In-House Referral:  NA  Discharge planning Services  CM Consult  Post Acute Care Choice:  Home Health Choice offered to:  Patient  DME Arranged:  Other see comment DME Agency:  NA  HH Arranged:  PT HH Agency:     Status of Service:  In process, will continue to follow  If discussed at Long Length of Stay Meetings, dates discussed:    Additional Comments:  Erenest Rasher, RN 08/11/2017, 5:44 PM

## 2017-08-12 ENCOUNTER — Encounter (HOSPITAL_COMMUNITY): Payer: Self-pay | Admitting: Orthopedic Surgery

## 2017-08-12 LAB — BASIC METABOLIC PANEL
Anion gap: 9 (ref 5–15)
BUN: 17 mg/dL (ref 6–20)
CHLORIDE: 103 mmol/L (ref 101–111)
CO2: 23 mmol/L (ref 22–32)
Calcium: 8.4 mg/dL — ABNORMAL LOW (ref 8.9–10.3)
Creatinine, Ser: 0.66 mg/dL (ref 0.61–1.24)
GFR calc Af Amer: >60 mL/min (ref >60)
GLUCOSE: 150 mg/dL — AB (ref 65–99)
POTASSIUM: 4.1 mmol/L (ref 3.5–5.1)
Sodium: 135 mmol/L (ref 135–145)

## 2017-08-12 LAB — CBC
HCT: 29.1 % — ABNORMAL LOW (ref 39.0–52.0)
Hemoglobin: 9.6 g/dL — ABNORMAL LOW (ref 13.0–17.0)
MCH: 30.5 pg (ref 26.0–34.0)
MCHC: 33 g/dL (ref 30.0–36.0)
MCV: 92.4 fL (ref 78.0–100.0)
PLATELETS: 214 10*3/uL (ref 150–400)
RBC: 3.15 MIL/uL — AB (ref 4.22–5.81)
RDW: 14.4 % (ref 11.5–15.5)
WBC: 10.8 10*3/uL — ABNORMAL HIGH (ref 4.0–10.5)

## 2017-08-12 MED ORDER — ACETAMINOPHEN 325 MG PO TABS: 650.0000 mg | ORAL_TABLET | ORAL | Status: DC | PRN

## 2017-08-12 MED ORDER — OXYCODONE HCL 10 MG PO TABS: 10.0000 mg | ORAL_TABLET | ORAL | 0 refills | Status: DC | PRN

## 2017-08-12 MED ORDER — POLYETHYLENE GLYCOL 3350 17 G PO PACK: PACK | ORAL | 0 refills | Status: DC

## 2017-08-12 MED ORDER — ASPIRIN 325 MG PO TBEC: DELAYED_RELEASE_TABLET | ORAL | 0 refills | Status: DC

## 2017-08-12 MED ORDER — DOCUSATE SODIUM 100 MG PO CAPS: ORAL_CAPSULE | ORAL | 0 refills | Status: DC

## 2017-08-12 NOTE — Progress Notes (Signed)
Subjective: 1 Day Post-Op Procedure(s) (LRB): RIGHT TOTAL KNEE ARTHROPLASTY (Right) HARDWARE REMOVAL RIGHT KNEE (Right) Patient reports pain as 8 on 0-10 scale.    Objective: Vital signs in last 24 hours: Temp:  [97.6 F (36.4 C)-98.6 F (37 C)] 97.6 F (36.4 C) (12/04 0621) Pulse Rate:  [59-85] 72 (12/04 0621) Resp:  [12-27] 13 (12/04 0621) BP: (113-137)/(54-99) 123/71 (12/04 0621) SpO2:  [93 %-99 %] 95 % (12/04 0621)  Intake/Output from previous day: 12/03 0701 - 12/04 0700 In: 1100 [I.V.:1100] Out: 965 [Urine:950; Blood:15] Intake/Output this shift: No intake/output data recorded.  Recent Labs    08/12/17 0549  HGB 9.6*   Recent Labs    08/12/17 0549  WBC 10.8*  RBC 3.15*  HCT 29.1*  PLT 214   Recent Labs    08/12/17 0549  NA 135  K 4.1  CL 103  CO2 23  BUN 17  CREATININE 0.66  GLUCOSE 150*  CALCIUM 8.4*   No results for input(s): LABPT, INR in the last 72 hours.  Neurologically intact ABD soft Sensation intact distally Intact pulses distally Dorsiflexion/Plantar flexion intact Incision: dressing C/D/I and no drainage  Assessment/Plan: 1 Day Post-Op Procedure(s) (LRB): RIGHT TOTAL KNEE ARTHROPLASTY (Right) HARDWARE REMOVAL RIGHT KNEE (Right)  Principal Problem:   Primary localized osteoarthrosis of the knee, right Active Problems:   Hyperlipemia   Essential hypertension   BUNDLE BRANCH BLOCK, RIGHT   Prostate cancer (HCC)   Abnormality of gait   GERD (gastroesophageal reflux disease)   First degree heart block   Primary localized osteoarthritis of right knee  Patient has a significantly antalgic gait due to left leg weakness due to recent back surgery and right leg limited mobility due to total knee yesterday.  We will keep him another day to increase he stability and better control his knee pain. Advance diet Up with therapy D/C IV fluids Plan for discharge tomorrow  Linda Hedges 08/12/2017, 8:52 AM

## 2017-08-12 NOTE — Discharge Instructions (Signed)
INSTRUCTIONS AFTER JOINT REPLACEMENT   o Remove items at home which could result in a fall. This includes throw rugs or furniture in walking pathways o ICE to the affected joint every three hours while awake for 30 minutes at a time, for at least the first 3-5 days, and then as needed for pain and swelling.  Continue to use ice for pain and swelling. You may notice swelling that will progress down to the foot and ankle.  This is normal after surgery.  Elevate your leg when you are not up walking on it.   o Continue to use the breathing machine you got in the hospital (incentive spirometer) which will help keep your temperature down.  It is common for your temperature to cycle up and down following surgery, especially at night when you are not up moving around and exerting yourself.  The breathing machine keeps your lungs expanded and your temperature down.   DIET:  As you were doing prior to hospitalization, we recommend a well-balanced diet.  DRESSING / WOUND CARE / SHOWERING  Keep the surgical dressing until follow up.  The dressing is water proof, so you can shower without any extra covering.  IF THE DRESSING FALLS OFF or the wound gets wet inside, change the dressing with sterile gauze.  Please use good hand washing techniques before changing the dressing.  Do not use any lotions or creams on the incision until instructed by your surgeon.    ACTIVITY  o Increase activity slowly as tolerated, but follow the weight bearing instructions below.   o No driving for 6 weeks or until further direction given by your physician.  You cannot drive while taking narcotics.  o No lifting or carrying greater than 10 lbs. until further directed by your surgeon. o Avoid periods of inactivity such as sitting longer than an hour when not asleep. This helps prevent blood clots.  o You may return to work once you are authorized by your doctor.     WEIGHT BEARING   Weight bearing as tolerated with assist  device (walker, cane, etc) as directed, use it as long as suggested by your surgeon or therapist, typically at least 2-4 weeks.   EXERCISES  Results after joint replacement surgery are often greatly improved when you follow the exercise, range of motion and muscle strengthening exercises prescribed by your doctor. Safety measures are also important to protect the joint from further injury. Any time any of these exercises cause you to have increased pain or swelling, decrease what you are doing until you are comfortable again and then slowly increase them. If you have problems or questions, call your caregiver or physical therapist for advice.   Rehabilitation is important following a joint replacement. After just a few days of immobilization, the muscles of the leg can become weakened and shrink (atrophy).  These exercises are designed to build up the tone and strength of the thigh and leg muscles and to improve motion. Often times heat used for twenty to thirty minutes before working out will loosen up your tissues and help with improving the range of motion but do not use heat for the first two weeks following surgery (sometimes heat can increase post-operative swelling).   These exercises can be done on a training (exercise) mat, on the floor, on a table or on a bed. Use whatever works the best and is most comfortable for you.    Use music or television while you are exercising so that  the exercises are a pleasant break in your day. This will make your life better with the exercises acting as a break in your routine that you can look forward to.   Perform all exercises about fifteen times, three times per day or as directed.  You should exercise both the operative leg and the other leg as well. ° °Exercises include: °  °• Quad Sets - Tighten up the muscle on the front of the thigh (Quad) and hold for 5-10 seconds.   °• Straight Leg Raises - With your knee straight (if you were given a brace, keep it on),  lift the leg to 60 degrees, hold for 3 seconds, and slowly lower the leg.  Perform this exercise against resistance later as your leg gets stronger.  °• Leg Slides: Lying on your back, slowly slide your foot toward your buttocks, bending your knee up off the floor (only go as far as is comfortable). Then slowly slide your foot back down until your leg is flat on the floor again.  °• Angel Wings: Lying on your back spread your legs to the side as far apart as you can without causing discomfort.  °• Hamstring Strength:  Lying on your back, push your heel against the floor with your leg straight by tightening up the muscles of your buttocks.  Repeat, but this time bend your knee to a comfortable angle, and push your heel against the floor.  You may put a pillow under the heel to make it more comfortable if necessary.  ° °A rehabilitation program following joint replacement surgery can speed recovery and prevent re-injury in the future due to weakened muscles. Contact your doctor or a physical therapist for more information on knee rehabilitation.  ° ° °CONSTIPATION ° °Constipation is defined medically as fewer than three stools per week and severe constipation as less than one stool per week.  Even if you have a regular bowel pattern at home, your normal regimen is likely to be disrupted due to multiple reasons following surgery.  Combination of anesthesia, postoperative narcotics, change in appetite and fluid intake all can affect your bowels.  ° °YOU MUST use at least one of the following options; they are listed in order of increasing strength to get the job done.  They are all available over the counter, and you may need to use some, POSSIBLY even all of these options:   ° °Drink plenty of fluids (prune juice may be helpful) and high fiber foods °Colace 100 mg by mouth twice a day  °Senokot for constipation as directed and as needed Dulcolax (bisacodyl), take with full glass of water  °Miralax (polyethylene glycol)  once or twice a day as needed. ° °If you have tried all these things and are unable to have a bowel movement in the first 3-4 days after surgery call either your surgeon or your primary doctor.   ° °If you experience loose stools or diarrhea, hold the medications until you stool forms back up.  If your symptoms do not get better within 1 week or if they get worse, check with your doctor.  If you experience "the worst abdominal pain ever" or develop nausea or vomiting, please contact the office immediately for further recommendations for treatment. ° ° °ITCHING:  If you experience itching with your medications, try taking only a single pain pill, or even half a pain pill at a time.  You can also use Benadryl over the counter for itching or also to   help with sleep.   TED HOSE STOCKINGS:  Use stockings on both legs until for at least 2 weeks or as directed by physician office. They may be removed at night for sleeping.  MEDICATIONS:  See your medication summary on the After Visit Summary that nursing will review with you.  You may have some home medications which will be placed on hold until you complete the course of blood thinner medication.  It is important for you to complete the blood thinner medication as prescribed.  PRECAUTIONS:  If you experience chest pain or shortness of breath - call 911 immediately for transfer to the hospital emergency department.   If you develop a fever greater that 101 F, purulent drainage from wound, increased redness or drainage from wound, foul odor from the wound/dressing, or calf pain - CONTACT YOUR SURGEON.                                                   FOLLOW-UP APPOINTMENTS:  If you do not already have a post-op appointment, please call the office for an appointment to be seen by your surgeon.  Guidelines for how soon to be seen are listed in your After Visit Summary, but are typically between 1-4 weeks after surgery.  OTHER INSTRUCTIONS:   Knee  Replacement:  Do not place pillow under knee, focus on keeping the knee straight while resting. CPM instructions: 0-90 degrees, 2 hours in the morning, 2 hours in the afternoon, and 2 hours in the evening. Place blue foam zero degree knee, curve side up under heel at all times except when in CPM or when walking.  DO NOT modify, tear, cut, or change the foam block in any way.  MAKE SURE YOU:   Understand these instructions.   Get help right away if you are not doing well or get worse.    Thank you for letting us be a part of your medical care team.  It is a privilege we respect greatly.  We hope these instructions will help you stay on track for a fast and full recovery!

## 2017-08-12 NOTE — Plan of Care (Signed)
  Pain Management: Pain level will decrease with appropriate interventions 08/12/2017 1109 - Progressing by Williams Che, RN   Safety: Ability to remain free from injury will improve 08/12/2017 1109 - Progressing by Williams Che, RN    Coping: Level of anxiety will decrease 08/12/2017 1109 - Progressing by Williams Che, RN   Clinical Measurements: Will remain free from infection 08/12/2017 1109 - Progressing by Williams Che, RN

## 2017-08-12 NOTE — Progress Notes (Signed)
Physical Therapy Treatment Patient Details Name: Eric Dominguez MRN: [id number] DOB: 10-Dec-1945 Today's Date: 08/12/2017    History of Present Illness 71 y.o. male s/p R TKA 12/3. PMH includes:  first degree heart block, prostate cancer, HTN, GERD,     PT Comments    Pt increased ambulation distance today and completed reviewing HEP. Pt required min guard to ambulate 200 ft with RW. Cues for postural control and equal step length. Pt with difficulty correcting secondary to pain. Plan to focus on improved gait quality and activity tolerance in PM session.    Follow Up Recommendations  Home health PT;Supervision for mobility/OOB;DC plan and follow up therapy as arranged by surgeon     Equipment Recommendations  None recommended by PT    Recommendations for Other Services       Precautions / Restrictions Precautions Precautions: Knee;Fall Precaution Booklet Issued: Yes (comment) Precaution Comments: reviewed seated HEP Restrictions Weight Bearing Restrictions: No RLE Weight Bearing: Weight bearing as tolerated    Mobility  Bed Mobility Overal bed mobility: Needs Assistance Bed Mobility: Sit to Supine       Sit to supine: Supervision   General bed mobility comments: up with OT on arrival. Supervision to return to bed.  Transfers Overall transfer level: Needs assistance Equipment used: Rolling walker (2 wheeled) Transfers: Sit to/from Stand Sit to Stand: Min guard         General transfer comment: min guard for safety and cues for technique  Ambulation/Gait Ambulation/Gait assistance: Min guard Ambulation Distance (Feet): 200 Feet Assistive device: Rolling walker (2 wheeled) Gait Pattern/deviations: Antalgic;Decreased stance time - right;Step-through pattern;Decreased step length - left;Trunk flexed Gait velocity: decreased Gait velocity interpretation: Below normal speed for age/gender General Gait Details: min guard, cues for upright posture and equal stride  length.  Pt unable to correct secondary to pain   Stairs            Wheelchair Mobility    Modified Rankin (Stroke Patients Only)       Balance Overall balance assessment: Needs assistance Sitting-balance support: Feet unsupported;No upper extremity supported Sitting balance-Leahy Scale: Normal     Standing balance support: During functional activity;Bilateral upper extremity supported Standing balance-Leahy Scale: Fair                              Cognition Arousal/Alertness: Awake/alert Behavior During Therapy: WFL for tasks assessed/performed Overall Cognitive Status: Within Functional Limits for tasks assessed                                        Exercises Total Joint Exercises Knee Flexion: AROM;Right;10 reps;Seated(towel under foot) General Exercises - Lower Extremity Long Arc Quad: AROM;Right;10 reps;Seated Heel Slides: AROM;Right;10 reps;Seated(towel under foot)    General Comments        Pertinent Vitals/Pain Pain Assessment: 0-10 Pain Score: 7  Pain Location: R knee after ambulation Pain Descriptors / Indicators: Operative site guarding;Grimacing;Aching;Burning Pain Intervention(s): Monitored during session;Repositioned    Home Living                      Prior Function            PT Goals (current goals can now be found in the care plan section) Acute Rehab PT Goals Patient Stated Goal: return home with HHPT PT Goal Formulation: With patient  Time For Goal Achievement: 08/18/17 Potential to Achieve Goals: Good Progress towards PT goals: Progressing toward goals    Frequency    7X/week      PT Plan Current plan remains appropriate    Co-evaluation              AM-PAC PT "6 Clicks" Daily Activity  Outcome Measure  Difficulty turning over in bed (including adjusting bedclothes, sheets and blankets)?: A Little Difficulty moving from lying on back to sitting on the side of the bed? : A  Little Difficulty sitting down on and standing up from a chair with arms (e.g., wheelchair, bedside commode, etc,.)?: A Little Help needed moving to and from a bed to chair (including a wheelchair)?: A Little Help needed walking in hospital room?: A Little Help needed climbing 3-5 steps with a railing? : A Little 6 Click Score: 18    End of Session Equipment Utilized During Treatment: Gait belt Activity Tolerance: Patient tolerated treatment well Patient left: in bed;with call bell/phone within reach Nurse Communication: Mobility status PT Visit Diagnosis: Unsteadiness on feet (R26.81);Other abnormalities of gait and mobility (R26.89);Repeated falls (R29.6);Muscle weakness (generalized) (M62.81);Pain Pain - Right/Left: Right Pain - part of body: Knee     Time: 0998-3382 PT Time Calculation (min) (ACUTE ONLY): 29 min  Charges:  $Gait Training: 8-22 mins $Therapeutic Exercise: 8-22 mins                    G Codes:       Benjiman Core, [place] Pager [phone] Acute Rehab  Allena Katz 08/12/2017, 10:29 AM

## 2017-08-12 NOTE — Progress Notes (Signed)
Physical Therapy Treatment Patient Details Name: Eric Dominguez MRN: [id number] DOB: 04/03/1946 Today's Date: 08/12/2017    History of Present Illness 71 y.o. male s/p R TKA 12/3. PMH includes:  first degree heart block, prostate cancer, HTN, GERD,     PT Comments    Pt able to increase ambulation distance this PM session; however continues to demonstrate poor gait quality. Pt with slight genu recurvatum on LLE during stance phase and decreased stride length on the L. Pt reports poor ROM in L ankle from previous surgery. On return to room, focused on strengthening non operative quads and stretching heel cord to improve gait stability and safety. Pt overall safe to return home with HHPT to follow up to maximize safety with mobility and functional independence.     Follow Up Recommendations  Home health PT;Supervision for mobility/OOB;DC plan and follow up therapy as arranged by surgeon     Equipment Recommendations  None recommended by PT    Recommendations for Other Services       Precautions / Restrictions Precautions Precautions: Knee;Fall Precaution Booklet Issued: Yes (comment) Precaution Comments: L knee with slight genu recurvatum Restrictions Weight Bearing Restrictions: No RLE Weight Bearing: Weight bearing as tolerated    Mobility  Bed Mobility Overal bed mobility: Modified Independent             General bed mobility comments: has bed at home with [facility] and foot of bed controls  Transfers Overall transfer level: Needs assistance Equipment used: Rolling walker (2 wheeled) Transfers: Sit to/from Stand Sit to Stand: Min assist         General transfer comment: pt required min A for sit>stand from bed this afternoon. Cues for techqniue and physical assist to power up  Ambulation/Gait Ambulation/Gait assistance: Min guard Ambulation Distance (Feet): 300 Feet Assistive device: Rolling walker (2 wheeled) Gait Pattern/deviations: Antalgic;Decreased stance time  - right;Step-through pattern;Decreased step length - left;Trunk flexed(L genu recurvatum) Gait velocity: decreased Gait velocity interpretation: Below normal speed for age/gender General Gait Details: pt with mild genu recurvatum on LLE. Likely due to decreased ankle ROM from previous ankle surgeries.  Cues for increased heel strike and even step length.   Stairs            Wheelchair Mobility    Modified Rankin (Stroke Patients Only)       Balance Overall balance assessment: Needs assistance Sitting-balance support: Feet unsupported;No upper extremity supported Sitting balance-Leahy Scale: Normal     Standing balance support: During functional activity;Bilateral upper extremity supported Standing balance-Leahy Scale: Fair                              Cognition Arousal/Alertness: Awake/alert Behavior During Therapy: WFL for tasks assessed/performed Overall Cognitive Status: Within Functional Limits for tasks assessed                                        Exercises General Exercises - Lower Extremity Short Arc Quad: AROM;Left;5 reps;Supine Straight Leg Raises: AROM;Right;5 reps;Supine Other Exercises Other Exercises: passive heel cord stretch; LLE; supine; 2x 30 seconds; To increase ankle flexibility for improved gait quality. Other Exercises: A&P talocrual mobs on LLE performed by evaluating PT to increase ankle mobility for improved gait quality and stability.    General Comments        Pertinent Vitals/Pain Pain Assessment: Faces Pain  Score: 7  Faces Pain Scale: Hurts little more Pain Location: R knee with Pain Descriptors / Indicators: Operative site guarding;Grimacing;Aching;Burning Pain Intervention(s): Monitored during session    Home Living Family/patient expects to be discharged to:: Private residence Living Arrangements: Spouse/significant other Available Help at Discharge: Family;Available 24 hours/day Type of Home:  House Home Access: Stairs to enter;Ramped entrance Entrance Stairs-Rails: Can reach both Home Layout: Two level;Able to live on main level with bedroom/bathroom Home Equipment: Walker - 2 wheels Additional Comments: shower is walk in flush to the floor with no door shower    Prior Function Level of Independence: Independent          PT Goals (current goals can now be found in the care plan section) Acute Rehab PT Goals Patient Stated Goal: to return to playing golf PT Goal Formulation: With patient Time For Goal Achievement: 08/18/17 Potential to Achieve Goals: Good Progress towards PT goals: Progressing toward goals    Frequency    7X/week      PT Plan Current plan remains appropriate    Co-evaluation              AM-PAC PT "6 Clicks" Daily Activity  Outcome Measure  Difficulty turning over in bed (including adjusting bedclothes, sheets and blankets)?: A Little Difficulty moving from lying on back to sitting on the side of the bed? : A Little Difficulty sitting down on and standing up from a chair with arms (e.g., wheelchair, bedside commode, etc,.)?: A Little Help needed moving to and from a bed to chair (including a wheelchair)?: A Little Help needed walking in hospital room?: A Little Help needed climbing 3-5 steps with a railing? : A Little 6 Click Score: 18    End of Session Equipment Utilized During Treatment: Gait belt Activity Tolerance: Patient tolerated treatment well Patient left: in bed;with call bell/phone within reach Nurse Communication: Mobility status PT Visit Diagnosis: Unsteadiness on feet (R26.81);Other abnormalities of gait and mobility (R26.89);Repeated falls (R29.6);Muscle weakness (generalized) (M62.81);Pain Pain - Right/Left: Right Pain - part of body: Knee     Time: 4081-4481 PT Time Calculation (min) (ACUTE ONLY): 40 min  Charges:  $Gait Training: 8-22 mins $Therapeutic Exercise: 8-22 mins $Therapeutic Activity: 8-22  mins                    G Codes:      Benjiman Core, [place] Pager [phone] Acute Rehab  Allena Katz 08/12/2017, 2:33 PM

## 2017-08-12 NOTE — Progress Notes (Signed)
Orthopedic Tech Progress Note Patient Details:  Eric Dominguez [birth year] [id number]  Patient ID: [name], male   DOB: [birth year], 71 y.o.   MRN: [id number]   [name] 08/12/2017, 1:24 PM Placed pt's rle on cpm @0 -90 degrees @1300 ; RN notified

## 2017-08-12 NOTE — Evaluation (Signed)
Occupational Therapy Evaluation Patient Details Name: Eric Dominguez MRN: [id number] DOB: 13-Jun-1946 Today's Date: 08/12/2017    History of Present Illness 71 y.o. male s/p R TKA 12/3. PMH includes:  first degree heart block, prostate cancer, HTN, GERD,    Clinical Impression   Patient evaluated by Occupational Therapy with no further acute OT needs identified. All education has been completed and the patient has no further questions. See below for any follow-up Occupational Therapy or equipment needs. OT to sign off. Thank you for referral.      Follow Up Recommendations  No OT follow up    Equipment Recommendations  None recommended by OT    Recommendations for Other Services       Precautions / Restrictions Precautions Precautions: Knee;Fall Precaution Booklet Issued: Yes (comment) Precaution Comments: reviewed seated HEP Restrictions Weight Bearing Restrictions: No RLE Weight Bearing: Weight bearing as tolerated      Mobility Bed Mobility Overal bed mobility: Modified Independent Bed Mobility: Sit to Supine       Sit to supine: Supervision   General bed mobility comments: has bed at home with [facility] and foot of bed controls  Transfers Overall transfer level: Needs assistance Equipment used: Rolling walker (2 wheeled) Transfers: Sit to/from Stand Sit to Stand: Supervision         General transfer comment: min guard for safety and cues for technique    Balance Overall balance assessment: Needs assistance Sitting-balance support: Feet unsupported;No upper extremity supported Sitting balance-Leahy Scale: Normal     Standing balance support: During functional activity;Bilateral upper extremity supported Standing balance-Leahy Scale: Fair                             ADL either performed or assessed with clinical judgement   ADL Overall ADL's : Needs assistance/impaired Eating/Feeding: Independent   Grooming: Wash/dry hands   Upper Body  Bathing: Independent   Lower Body Bathing: Minimal assistance Lower Body Bathing Details (indicate cue type and reason): pt educated on washing with reacher and will have wife (A)     Lower Body Dressing: Minimal assistance;Sit to/from stand Lower Body Dressing Details (indicate cue type and reason): pt plans to have wife (A) but was educated on dressing with reacher. Pt has reacher already at home. pt reports dressing this AM with PA  Toilet Transfer: Supervision/safety       Tub/ Shower Transfer: English as a second language teacher Details (indicate cue type and reason): educated on transfer due to throw rug in bathroom Functional mobility during ADLs: Supervision/safety General ADL Comments: pt reports incr pain today compared to 12/3   Educated on new linen each shower  And to avoid washing directly over incision   Vision Patient Visual Report: No change from baseline       Perception     Praxis      Pertinent Vitals/Pain Pain Assessment: 0-10 Pain Score: 7  Pain Location: R knee after ambulation Pain Descriptors / Indicators: Operative site guarding;Grimacing;Aching;Burning Pain Intervention(s): Monitored during session;Premedicated before session     Hand Dominance Right   Extremity/Trunk Assessment Upper Extremity Assessment Upper Extremity Assessment: Overall WFL for tasks assessed   Lower Extremity Assessment Lower Extremity Assessment: Defer to PT evaluation   Cervical / Trunk Assessment Cervical / Trunk Assessment: Normal   Communication Communication Communication: No difficulties   Cognition Arousal/Alertness: Awake/alert Behavior During Therapy: WFL for tasks assessed/performed Overall Cognitive Status: Within Functional Limits for tasks assessed  General Comments       Exercises Exercises: General Lower Extremity Total Joint Exercises Knee Flexion: AROM;Right;10 reps;Seated(towel under  foot) General Exercises - Lower Extremity Long Arc Quad: AROM;Right;10 reps;Seated Heel Slides: AROM;Right;10 reps;Seated(towel under foot)   Shoulder Instructions      Home Living Family/patient expects to be discharged to:: Private residence Living Arrangements: Spouse/significant other Available Help at Discharge: Family;Available 24 hours/day Type of Home: House Home Access: Stairs to enter;Ramped entrance Entrance Stairs-Number of Steps: 8 Entrance Stairs-Rails: Can reach both Home Layout: Two level;Able to live on main level with bedroom/bathroom     Bathroom Shower/Tub: Occupational psychologist: Standard Bathroom Accessibility: Yes   Home Equipment: Environmental consultant - 2 wheels   Additional Comments: shower is walk in flush to the floor with no door shower      Prior Functioning/Environment Level of Independence: Independent                 OT Problem List:        OT Treatment/Interventions:      OT Goals(Current goals can be found in the care plan section) Acute Rehab OT Goals Patient Stated Goal: to return to playing golf  OT Frequency:     Barriers to D/C:            Co-evaluation              AM-PAC PT "6 Clicks" Daily Activity     Outcome Measure Help from another person eating meals?: None Help from another person taking care of personal grooming?: None Help from another person toileting, which includes using toliet, bedpan, or urinal?: A Little Help from another person bathing (including washing, rinsing, drying)?: None Help from another person to put on and taking off regular upper body clothing?: A Little Help from another person to put on and taking off regular lower body clothing?: A Little 6 Click Score: 21   End of Session Equipment Utilized During Treatment: Gait belt;Rolling walker CPM Right Knee CPM Right Knee: Off Right Knee Flexion (Degrees): 90 Right Knee Extension (Degrees): -2 Nurse Communication: Mobility  status;Precautions  Activity Tolerance: Patient tolerated treatment well Patient left: Other (comment)(in hall to go with PTA)  OT Visit Diagnosis: Unsteadiness on feet (R26.81)                Time: 3716-9678 OT Time Calculation (min): 13 min Charges:  OT General Charges $OT Visit: 1 Visit OT Evaluation $OT Eval Low Complexity: 1 Low G-CodesJeri Modena   OTR/L Pager: [phone] Office: [phone] .   Parke Poisson B 08/12/2017, 10:40 AM

## 2017-08-12 NOTE — Progress Notes (Signed)
Orthopedic Tech Progress Note Patient Details:  Eric Dominguez 1946-04-23 [id number]  CPM Right Knee CPM Right Knee: On Right Knee Flexion (Degrees): 90 Right Knee Extension (Degrees): 0  Post Interventions Patient Tolerated: Well Instructions Provided: Care of device  Kristopher Oppenheim 08/12/2017, 1:07 PM

## 2017-08-13 ENCOUNTER — Encounter (HOSPITAL_COMMUNITY): Payer: Self-pay | Admitting: Orthopedic Surgery

## 2017-08-13 LAB — BASIC METABOLIC PANEL
Anion gap: 7 (ref 5–15)
BUN: 12 mg/dL (ref 6–20)
CHLORIDE: 105 mmol/L (ref 101–111)
CO2: 26 mmol/L (ref 22–32)
CREATININE: 0.63 mg/dL (ref 0.61–1.24)
Calcium: 8.8 mg/dL — ABNORMAL LOW (ref 8.9–10.3)
GFR calc non Af Amer: >60 mL/min (ref >60)
Glucose, Bld: 132 mg/dL — ABNORMAL HIGH (ref 65–99)
Potassium: 4.3 mmol/L (ref 3.5–5.1)
Sodium: 138 mmol/L (ref 135–145)

## 2017-08-13 LAB — CBC
HEMATOCRIT: 27.2 % — AB (ref 39.0–52.0)
HEMOGLOBIN: 9 g/dL — AB (ref 13.0–17.0)
MCH: 30.6 pg (ref 26.0–34.0)
MCHC: 33.1 g/dL (ref 30.0–36.0)
MCV: 92.5 fL (ref 78.0–100.0)
Platelets: 210 10*3/uL (ref 150–400)
RBC: 2.94 MIL/uL — ABNORMAL LOW (ref 4.22–5.81)
RDW: 14.5 % (ref 11.5–15.5)
WBC: 8.9 10*3/uL (ref 4.0–10.5)

## 2017-08-13 NOTE — Discharge Summary (Signed)
Patient ID: Eric Dominguez MRN: [id number] DOB/AGE: 71-17-1947 71 y.o.  Admit date: 08/11/2017 Discharge date: 08/13/2017  Admission Diagnoses:  Principal Problem:   Primary localized osteoarthrosis of the knee, right Active Problems:   Hyperlipemia   Essential hypertension   BUNDLE BRANCH BLOCK, RIGHT   Prostate cancer ([place])   Abnormality of gait   GERD (gastroesophageal reflux disease)   First degree heart block   Primary localized osteoarthritis of right knee   Discharge Diagnoses:  Same  Past Medical History:  Diagnosis Date  . First degree heart block   . GERD (gastroesophageal reflux disease)    OCCASIONAL ZANTAC AT HS  . Hepatitis 1975  . Hypertension   . Primary localized osteoarthrosis of the knee, right 07/29/2017  . Prostate cancer ([place]) 11/26/12   gleason3+4=7,& 3+3=6,PSA=4.65 volume=42.5cc  . RBBB     Surgeries: Procedure(s): RIGHT TOTAL KNEE ARTHROPLASTY HARDWARE REMOVAL RIGHT KNEE on 08/11/2017   Consultants:   Discharged Condition: Improved  Hospital Course: Ellison Rieth is an 71 y.o. male who was admitted 08/11/2017 for operative treatment ofPrimary localized osteoarthrosis of the knee, right. Patient has severe unremitting pain that affects sleep, daily activities, and work/hobbies. After pre-op clearance the patient was taken to the operating room on 08/11/2017 and underwent  Procedure(s): RIGHT TOTAL KNEE ARTHROPLASTY HARDWARE REMOVAL RIGHT KNEE.    Patient was given perioperative antibiotics:  Anti-infectives (From admission, onward)   Start     Dose/Rate Route Frequency Ordered Stop   08/11/17 1800  vancomycin (VANCOCIN) IVPB 1000 mg/200 mL premix     1,000 mg 200 mL/hr over 60 Minutes Intravenous Every 12 hours 08/11/17 1009 08/11/17 1834   08/11/17 0610  ceFAZolin (ANCEF) 2-4 GM/100ML-% IVPB    Comments:  Ernesta Amble   : cabinet override      08/11/17 0610 08/11/17 0729   08/11/17 0606  ceFAZolin (ANCEF) IVPB 2g/100 mL premix     2 g 200  mL/hr over 30 Minutes Intravenous On call to O.R. 08/11/17 0606 08/11/17 0744       Patient was given sequential compression devices, early ambulation, and chemoprophylaxis to prevent DVT.  Post op day one patient had significant difficulty with pain control requiring IV pain medication and had difficulty with weakness in both legs due to recent back surgery affecting left leg and recent right total knee.  He was unsafe to be discharged on Post op day one due to this lower extremity weakness and antalgic gait.  He worked with PT on both post op day 1 and day 2 on strengthening and gait and was safe to discharge on post op day 2.  Patient benefited maximally from hospital stay and there were no complications.    Recent vital signs:  Patient Vitals for the past 24 hrs:  BP Temp Temp src Pulse Resp SpO2  08/13/17 0546 (!) 144/63 98.2 F (36.8 C) Oral 69 18 98 %  08/12/17 2201 139/77 98.2 F (36.8 C) Tympanic 64 - 98 %  08/12/17 1224 137/63 98.7 F (37.1 C) Oral 73 - 96 %  08/12/17 0949 (!) 148/68 97.6 F (36.4 C) Oral 80 - 95 %     Recent laboratory studies:  Recent Labs    08/12/17 0549 08/13/17 0527  WBC 10.8* 8.9  HGB 9.6* 9.0*  HCT 29.1* 27.2*  PLT 214 210  NA 135 138  K 4.1 4.3  CL 103 105  CO2 23 26  BUN 17 12  CREATININE 0.66 0.63  GLUCOSE 150* 132*  CALCIUM 8.4* 8.8*     Discharge Medications:   Allergies as of 08/13/2017      Reactions   Penicillins Rash, Other (See Comments)   Fever Has patient had a PCN reaction causing immediate rash, facial/tongue/throat swelling, SOB or lightheadedness with hypotension: No Has patient had a PCN reaction causing severe rash involving mucus membranes or skin necrosis: No Has patient had a PCN reaction that required hospitalization: No Has patient had a PCN reaction occurring within the last 10 years: No If all of the above answers are "NO", then may proceed with Cephalosporin use.      Medication List    STOP taking  these medications   aspirin 81 MG tablet Replaced by:  aspirin 325 MG EC tablet   HYDROcodone-acetaminophen 5-325 MG tablet Commonly known as:  NORCO/VICODIN   MOVIPREP 100 g Solr Generic drug:  peg 3350 powder   naproxen sodium 220 MG tablet Commonly known as:  ALEVE     TAKE these medications   acetaminophen 325 MG tablet Commonly known as:  TYLENOL Take 2 tablets (650 mg total) by mouth every 4 (four) hours as needed for mild pain ((score 1 to 3) or temp > 100.5).   aspirin 325 MG EC tablet 1 tab a day for the next 30 days to prevent blood clots Replaces:  aspirin 81 MG tablet   atorvastatin 20 MG tablet Commonly known as:  LIPITOR Take 20 mg at bedtime by mouth.   BIOFREEZE EX Apply 1 application as needed topically (for pain).   docusate sodium 100 MG capsule Commonly known as:  COLACE 1 tab 2 times a day while on narcotics.  STOOL SOFTENER   lisinopril 20 MG tablet Commonly known as:  PRINIVIL,ZESTRIL Take 20 mg daily by mouth.   Oxycodone HCl 10 MG Tabs Take 1 tablet (10 mg total) by mouth every 4 (four) hours as needed for severe pain.   polyethylene glycol packet Commonly known as:  MIRALAX / GLYCOLAX 17grams in 6 oz of water twice a day until bowel movement.  LAXITIVE.  Restart if two days since last bowel movement       Diagnostic Studies: No results found.  Disposition: 01-Home or Self Care    Follow-up Information    Elsie Saas, MD Follow up on 08/25/2017.   Specialty:  Orthopedic Surgery Why:  appointment time 4 pm Contact information: [address] [place] [address] [phone]        Specialists, [facility] Follow up on 08/26/2017.   Specialty:  Orthopedic Surgery Why:  check into physical therapy at Dr Archie Endo office at 9:30 for 10 am physical therapy appointment with Joellen Jersey. Contact information: [place] [address] [place] Follow up  on 08/12/2017.   Specialty:  [facility] Contact information: [address] [place] [address] [phone]            Signed: Linda Hedges 08/13/2017, 8:07 AM

## 2017-08-13 NOTE — Anesthesia Postprocedure Evaluation (Signed)
Anesthesia Post Note  Patient: Linkyn Gobin  Procedure(s) Performed: RIGHT TOTAL KNEE ARTHROPLASTY (Right Knee) HARDWARE REMOVAL RIGHT KNEE (Right Knee)     Patient location during evaluation: PACU Anesthesia Type: Regional, MAC and Spinal Level of consciousness: awake and alert Pain management: pain level controlled Vital Signs Assessment: post-procedure vital signs reviewed and stable Respiratory status: spontaneous breathing, nonlabored ventilation, respiratory function stable and patient connected to nasal cannula oxygen Cardiovascular status: stable and blood pressure returned to baseline Postop Assessment: no apparent nausea or vomiting and spinal receding Anesthetic complications: no    Last Vitals:  Vitals:   08/12/17 2201 08/13/17 0546  BP: 139/77 (!) 144/63  Pulse: 64 69  Resp:  18  Temp: 36.8 C 36.8 C  SpO2: 98% 98%    Last Pain:  Vitals:   08/13/17 0546  TempSrc: Oral  PainSc:                  Daril Warga

## 2017-08-13 NOTE — Care Management Note (Signed)
Case Management Note  Patient Details  Name: Eric Dominguez MRN: [id number] Date of Birth: Apr 20, 1946  Subjective/Objective:   71 yr old gentleman s/p right total Knee arthroplasty.                 Action/Plan:  Case manager spoke with patient concerning discharge plan and DME. Patient was preoperatively setup with [facility], no changes. He has DME . Patient will have family support at discharge.    Expected Discharge Date:  08/13/17               Expected Discharge Plan:  [place]  In-House Referral:  NA  Discharge planning Services  CM Consult  Post Acute Care Choice:  Home Health Choice offered to:  Patient  DME Arranged:  Other see comment DME Agency:  NA  HH Arranged:  PT [place] Agency:  [facility] (formerly [facility])  Status of Service:  Completed, signed off  If discussed at [employer] of Stay Meetings, dates discussed:    Additional Comments:  Ninfa Meeker, RN 08/13/2017, 9:40 AM

## 2017-08-13 NOTE — Progress Notes (Signed)
Physical Therapy Treatment Patient Details Name: Eric Dominguez MRN: [id number] DOB: Oct 20, 1945 Today's Date: 08/13/2017    History of Present Illness 71 y.o. male s/p R TKA 12/3. PMH includes:  first degree heart block, prostate cancer, HTN, GERD,     PT Comments    Session focused on improving gait mechanics and activity tolerance. Patient ambulating better and with less assistance. Pt  educated on safety considerations for home and have no further questions or concerns at this time. Pt has met all functional goals and will benefit from skilled home health PT when medically cleared for d/c.      Follow Up Recommendations  Home health PT;Supervision for mobility/OOB;DC plan and follow up therapy as arranged by surgeon     Equipment Recommendations  None recommended by PT    Recommendations for Other Services       Precautions / Restrictions Precautions Precautions: Knee;Fall Precaution Booklet Issued: Yes (comment) Precaution Comments: L knee with slight genu recurvatum Restrictions Weight Bearing Restrictions: No RLE Weight Bearing: Weight bearing as tolerated    Mobility  Bed Mobility Overal bed mobility: Modified Independent Bed Mobility: Sit to Supine     Supine to sit: Supervision Sit to supine: Supervision      Transfers Overall transfer level: Needs assistance Equipment used: Rolling walker (2 wheeled) Transfers: Sit to/from Stand              Ambulation/Gait Ambulation/Gait assistance: Min guard;Supervision Ambulation Distance (Feet): 505 Feet Assistive device: Rolling walker (2 wheeled) Gait Pattern/deviations: Antalgic;Decreased stance time - right;Step-through pattern;Decreased step length - left;Trunk flexed Gait velocity: decreased   General Gait Details: pt with mild genu recurvatum on LLE. Likely due to decreased ankle ROM from previous ankle surgeries.  Cues for increased heel strike and even step length . Progression today with improved  heel strike and symetrical step length. slightly less hypterextension.    Stairs            Wheelchair Mobility    Modified Rankin (Stroke Patients Only)       Balance Overall balance assessment: Needs assistance Sitting-balance support: Feet unsupported;No upper extremity supported Sitting balance-Leahy Scale: Normal     Standing balance support: During functional activity;Bilateral upper extremity supported Standing balance-Leahy Scale: Fair                              Cognition Arousal/Alertness: Awake/alert Behavior During Therapy: WFL for tasks assessed/performed Overall Cognitive Status: Within Functional Limits for tasks assessed                                        Exercises Other Exercises Other Exercises: passive heel cord stretch; LLE; supine; 2x 30 seconds; To increase ankle flexibility for improved gait quality. Other Exercises: AP TC mobs and Distraction to improve Dorsiflexion    General Comments General comments (skin integrity, edema, etc.): VSS after long distance ambulation.      Pertinent Vitals/Pain Pain Assessment: 0-10 Pain Score: 0-No pain Pain Location: R knee with Pain Descriptors / Indicators: Operative site guarding;Grimacing;Aching;Burning Pain Intervention(s): Limited activity within patient's tolerance;Monitored during session;Premedicated before session    Home Living                      Prior Function            PT Goals (  current goals can now be found in the care plan section) Acute Rehab PT Goals Patient Stated Goal: to return to playing golf PT Goal Formulation: With patient Time For Goal Achievement: 08/18/17 Potential to Achieve Goals: Good Progress towards PT goals: Goals met/education completed, patient discharged from PT    Frequency           PT Plan Current plan remains appropriate    Co-evaluation              AM-PAC PT "6 Clicks" Daily Activity   Outcome Measure  Difficulty turning over in bed (including adjusting bedclothes, sheets and blankets)?: A Little Difficulty moving from lying on back to sitting on the side of the bed? : A Little Difficulty sitting down on and standing up from a chair with arms (e.g., wheelchair, bedside commode, etc,.)?: A Little Help needed moving to and from a bed to chair (including a wheelchair)?: A Little Help needed walking in hospital room?: A Little Help needed climbing 3-5 steps with a railing? : A Little 6 Click Score: 18    End of Session Equipment Utilized During Treatment: Gait belt Activity Tolerance: Patient tolerated treatment well Patient left: in bed;with call bell/phone within reach Nurse Communication: Mobility status PT Visit Diagnosis: Unsteadiness on feet (R26.81);Other abnormalities of gait and mobility (R26.89);Repeated falls (R29.6);Muscle weakness (generalized) (M62.81);Pain Pain - Right/Left: Right Pain - part of body: Knee     Time: 0808-0836(time spent with patient phone call) PT Time Calculation (min) (ACUTE ONLY): 28 min  Charges:  $Gait Training: 8-22 mins                    G Codes:       Reinaldo Berber, PT, DPT Acute Rehab Services Pager: [phone]     Reinaldo Berber 08/13/2017, 8:43 AM

## 2018-06-03 ENCOUNTER — Encounter (INDEPENDENT_AMBULATORY_CARE_PROVIDER_SITE_OTHER): Payer: Self-pay

## 2018-06-03 ENCOUNTER — Ambulatory Visit: Payer: Medicare Other | Admitting: Internal Medicine

## 2018-06-03 ENCOUNTER — Encounter: Payer: Self-pay | Admitting: Internal Medicine

## 2018-06-03 VITALS — BP 120/60 | HR 84 | Ht 72.0 in | Wt 214.0 lb

## 2018-06-03 DIAGNOSIS — R151 Fecal smearing: Secondary | ICD-10-CM | POA: Diagnosis not present

## 2018-06-03 DIAGNOSIS — K5900 Constipation, unspecified: Secondary | ICD-10-CM | POA: Diagnosis not present

## 2018-06-03 DIAGNOSIS — Z86010 Personal history of colonic polyps: Secondary | ICD-10-CM | POA: Diagnosis not present

## 2018-06-03 NOTE — Patient Instructions (Signed)
As discussed with Dr. Henrene Pastor, take 2 tablespoons of Metamucil in 16 ounces of water or juice daily.  Please follow up as needed

## 2018-06-03 NOTE — Progress Notes (Signed)
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  The patient was last seen in this office May 2016 when he underwent surveillance colonoscopy.  In addition to mild left-sided diverticulosis he was noted to have 3 small polyps which were removed and found to be a tubular adenoma, hyperplastic polyp, and benign colonic mucosa.  Follow-up in 5 years recommended.  Patient tells me that a little over a year ago he started to develop problems with constipation.  In addition he would notice with extensive walking that he would notice some fecal seepage.  Occasionally, and ability to discriminate between flatus and liquid.  He denies abdominal pain or rectal bleeding.  Stable weight.  Laboratories from December 2018 post knee replacement revealed hemoglobin 9.0.  Normal basic metabolic panel.  REVIEW OF SYSTEMS:  All non-GI ROS negative unless otherwise stated in the HPI except for sinus and allergy trouble, arthritis, swelling of feet and legs  Past Medical History:  Diagnosis Date  . First degree heart block   . GERD (gastroesophageal reflux disease)    OCCASIONAL ZANTAC AT HS  . Hepatitis 1975  . Hypertension   . Primary localized osteoarthrosis of the knee, right 07/29/2017  . Prostate cancer ([place]) 11/26/12   gleason3+4=7,& 3+3=6,PSA=4.65 volume=42.5cc  . RBBB     Past Surgical History:  Procedure Laterality Date  . COLONOSCOPY    . CYSTOSCOPY N/A 01/14/2013   Procedure: CYSTOSCOPY FLEXIBLE;  Surgeon: Malka So, MD;  Location: [facility];  Service: Urology;  Laterality: N/A;  no seeds found in bladder  . HARDWARE REMOVAL Right 08/11/2017   Procedure: HARDWARE REMOVAL RIGHT KNEE;  Surgeon: Elsie Saas, MD;  Location: [place];  Service: Orthopedics;   Laterality: Right;  . LEFT ANKLE SURGERY  2012  . RADIOACTIVE SEED IMPLANT N/A 01/14/2013   Procedure: RADIOACTIVE SEED IMPLANT;  Surgeon: Malka So, MD;  Location: [facility];  Service: Urology;  Laterality: N/A;  68    seeds implanted  . REPAIR TORN BICEP  Right 2012  . TOTAL KNEE ARTHROPLASTY Right 08/11/2017   Procedure: RIGHT TOTAL KNEE ARTHROPLASTY;  Surgeon: Elsie Saas, MD;  Location: [place];  Service: Orthopedics;  Laterality: Right;    Social History Rey Dansby  reports that he has never smoked. He has never used smokeless tobacco. He reports that he drinks about 14.0 standard drinks of alcohol per week. He reports that he does not use drugs.  family history includes Cancer in his father.  Allergies  Allergen Reactions  . Penicillins Rash and Other (See Comments)    Fever Has patient had a PCN reaction causing immediate rash, facial/tongue/throat swelling, SOB or lightheadedness with hypotension: No Has patient had a PCN reaction causing severe rash involving mucus membranes or skin necrosis: No Has patient had a PCN reaction that required hospitalization: No Has patient had a PCN reaction occurring within the last 10 years: No If all of the above answers are "NO", then may proceed with Cephalosporin use.        PHYSICAL EXAMINATION: Vital signs: BP 120/60   Pulse 84   Ht 6' (1.829 m)   Wt 214 lb (97.1 kg)   BMI 29.02 kg/m   Constitutional: generally well-appearing, no acute distress Psychiatric: alert and oriented x3, cooperative Eyes: extraocular  movements intact, anicteric, conjunctiva pink Mouth: oral pharynx moist, no lesions Neck: supple no lymphadenopathy Cardiovascular: heart regular rate and rhythm, no murmur Lungs: clear to auscultation bilaterally Abdomen: soft, nontender, nondistended, no obvious ascites, no peritoneal signs, normal bowel sounds, no organomegaly Rectal: Omitted Extremities: no clubbing, cyanosis, or lower extremity  edema bilaterally.  Surgical incisions well-healed Skin: no lesions on visible extremities Neuro: No focal deficits.  Cranial nerves intact  ASSESSMENT:  1.  Functional constipation 2.  Minor fecal seepage with activity 3.  Colonoscopy May 2016 with precancerous polyps and diverticulosis 4.  History of prostate cancer status post seed implantation with most recent PSA negligible   PLAN:  1.  Metamucil 2 tablespoons daily in 16 ounces of water or juice 2.  Increase water intake 3.  Wear protective pad when active to mitigate against fecal seepage if it were to persist 4.  Contact this office if these measures are not helpful 5.  Surveillance colonoscopy around May 2021

## 2019-10-11 ENCOUNTER — Other Ambulatory Visit: Payer: Self-pay | Admitting: Neurosurgery

## 2019-10-11 DIAGNOSIS — M4802 Spinal stenosis, cervical region: Secondary | ICD-10-CM

## 2019-10-11 DIAGNOSIS — G992 Myelopathy in diseases classified elsewhere: Secondary | ICD-10-CM

## 2019-10-12 ENCOUNTER — Ambulatory Visit
Admission: RE | Admit: 2019-10-12 | Discharge: 2019-10-12 | Disposition: A | Discharge status: Home / Self Care | Payer: Medicare Other | Source: Ambulatory Visit | Attending: Neurosurgery | Admitting: Neurosurgery

## 2019-10-12 DIAGNOSIS — G992 Myelopathy in diseases classified elsewhere: Secondary | ICD-10-CM

## 2019-11-01 ENCOUNTER — Ambulatory Visit: Payer: Medicare Other

## 2020-03-30 ENCOUNTER — Other Ambulatory Visit: Payer: Self-pay

## 2020-03-30 ENCOUNTER — Ambulatory Visit: Payer: Medicare Other

## 2020-03-30 VITALS — Ht 72.0 in | Wt 190.0 lb

## 2020-03-30 NOTE — Progress Notes (Signed)

## 2020-03-30 NOTE — Progress Notes (Signed)
Conducted majority of pre-visit. Patient then realized that there is a conflict on this date and could not commit to any of the available appointments.   Patient will call back to schedule something for October and will do his pre-visit virtually as a refresher from today's visit.

## 2020-04-13 ENCOUNTER — Encounter: Payer: Medicare Other | Admitting: Internal Medicine

## 2020-07-08 ENCOUNTER — Ambulatory Visit: Payer: Medicare Other | Attending: Internal Medicine

## 2020-07-08 DIAGNOSIS — Z23 Encounter for immunization: Secondary | ICD-10-CM

## 2020-09-13 ENCOUNTER — Other Ambulatory Visit: Payer: Self-pay

## 2020-09-13 ENCOUNTER — Ambulatory Visit (AMBULATORY_SURGERY_CENTER): Payer: Self-pay

## 2020-09-13 VITALS — Ht 72.0 in | Wt 202.0 lb

## 2020-09-13 DIAGNOSIS — Z86010 Personal history of colonic polyps: Secondary | ICD-10-CM

## 2020-09-13 MED ORDER — SUTAB 1479-225-188 MG PO TABS: 12.0000 | ORAL_TABLET | ORAL | 0 refills | Status: DC

## 2020-09-13 NOTE — Progress Notes (Signed)
No allergies to soy or egg Pt is not on blood thinners or diet pills Denies issues with sedation/intubation Denies atrial flutter/fib  Denies constipation--states he goes 3 or more times a week and uses only a stool softener.   Emmi instructions given to pt  Pt is aware of Covid safety and care partner requirements.  RBBB and First degree heart block stable- no symptoms - only shows up in ECGS.  Is monitored per pt. Dr. Clelia Croft PCP.

## 2020-09-14 ENCOUNTER — Telehealth: Payer: Self-pay | Admitting: Internal Medicine

## 2020-09-14 NOTE — Telephone Encounter (Signed)
Called pt - informed we do not do PA on preps and the reason why- - he needs to take the [place] coupon given yesterday at [facility] and have the Pharmacy run it as directed and will drop price to $40-50 Dollars-   If pt continues to have issues, please call back- Hilda Lias PV

## 2020-09-20 ENCOUNTER — Encounter: Payer: Self-pay | Admitting: Internal Medicine

## 2020-09-26 ENCOUNTER — Encounter: Payer: Medicare Other | Admitting: Internal Medicine

## 2020-09-28 ENCOUNTER — Encounter: Payer: Self-pay | Admitting: Internal Medicine

## 2020-09-28 ENCOUNTER — Ambulatory Visit (AMBULATORY_SURGERY_CENTER): Payer: Medicare Other | Admitting: Internal Medicine

## 2020-09-28 ENCOUNTER — Other Ambulatory Visit: Payer: Self-pay

## 2020-09-28 VITALS — BP 115/62 | HR 57 | Temp 97.5°F | Resp 11 | Ht 72.0 in | Wt 202.0 lb

## 2020-09-28 DIAGNOSIS — Z86010 Personal history of colonic polyps: Secondary | ICD-10-CM

## 2020-09-28 DIAGNOSIS — D122 Benign neoplasm of ascending colon: Secondary | ICD-10-CM

## 2020-09-28 MED ORDER — SODIUM CHLORIDE 0.9 % IV SOLN: 500.0000 mL | Freq: Once | INTRAVENOUS | Status: DC

## 2020-09-28 NOTE — Op Note (Signed)
[place] Patient Name: Eric Dominguez Procedure Date: 09/28/2020 7:44 AM MRN: [id number] Endoscopist: Docia Chuck. Henrene Pastor , MD Age: 75 Referring MD:  Date of Birth: [birth year] Gender: Male Account #: [id number] Procedure:                Colonoscopy with cold snare polypectomy x 1 Indications:              High risk colon cancer surveillance: Personal                            history of non-advanced adenomas. Previous                            examinations 2009, 2016 Medicines:                Monitored Anesthesia Care Procedure:                Pre-Anesthesia Assessment:                           - Prior to the procedure, a History and Physical                            was performed, and patient medications and                            allergies were reviewed. The patient's tolerance of                            previous anesthesia was also reviewed. The risks                            and benefits of the procedure and the sedation                            options and risks were discussed with the patient.                            All questions were answered, and informed consent                            was obtained. Prior Anticoagulants: The patient has                            taken no previous anticoagulant or antiplatelet                            agents. ASA Grade Assessment: II - A patient with                            mild systemic disease. After reviewing the risks                            and benefits, the patient was deemed in  satisfactory condition to undergo the procedure.                           After obtaining informed consent, the colonoscope                            was passed under direct vision. Throughout the                            procedure, the patient's blood pressure, pulse, and                            oxygen saturations were monitored continuously. The                            Olympus CF-HQ190L  (Serial# [id number]) Colonoscope was                            introduced through the anus and advanced to the the                            cecum, identified by appendiceal orifice and                            ileocecal valve. The ileocecal valve, appendiceal                            orifice, and rectum were photographed. The quality                            of the bowel preparation was good. The colonoscopy                            was performed without difficulty. The patient                            tolerated the procedure well. The bowel preparation                            used was SUPREP via split dose instruction. Scope In: 9:20:48 AM Scope Out: 9:39:41 AM Scope Withdrawal Time: 0 hours 12 minutes 12 seconds  Total Procedure Duration: 0 hours 18 minutes 53 seconds  Findings:                 A 1 mm polyp was found in the ascending colon. The                            polyp was sessile. The polyp was removed with a                            cold snare. Resection and retrieval were complete.                           A few diverticula  were found in the left colon and                            right colon.                           The exam was otherwise without abnormality on                            direct and retroflexion views. Complications:            No immediate complications. Estimated blood loss:                            None. Estimated Blood Loss:     Estimated blood loss: none. Impression:               - One 1 mm polyp in the ascending colon, removed                            with a cold snare. Resected and retrieved.                           - Diverticulosis in the left colon and in the right                            colon.                           - The examination was otherwise normal on direct                            and retroflexion views. Recommendation:           - Repeat colonoscopy is not recommended for                             surveillance.                           - Patient has a contact number available for                            emergencies. The signs and symptoms of potential                            delayed complications were discussed with the                            patient. Return to normal activities tomorrow.                            Written discharge instructions were provided to the                            patient.                           -  Resume previous diet.                           - Continue present medications.                           - Await pathology results. Wilhemina Bonito. Marina Goodell, MD 09/28/2020 9:47:13 AM This report has been signed electronically.

## 2020-09-28 NOTE — Patient Instructions (Signed)
Handouts given for polyps and diverticulosis.  YOU HAD AN ENDOSCOPIC PROCEDURE TODAY AT [facility]:   Refer to the procedure report that was given to you for any specific questions about what was found during the examination.  If the procedure report does not answer your questions, please call your gastroenterologist to clarify.  If you requested that your care partner not be given the details of your procedure findings, then the procedure report has been included in a sealed envelope for you to review at your convenience later.  YOU SHOULD EXPECT: Some feelings of bloating in the abdomen. Passage of more gas than usual.  Walking can help get rid of the air that was put into your GI tract during the procedure and reduce the bloating. If you had a lower endoscopy (such as a colonoscopy or flexible sigmoidoscopy) you may notice spotting of blood in your stool or on the toilet paper. If you underwent a bowel prep for your procedure, you may not have a normal bowel movement for a few days.  Please Note:  You might notice some irritation and congestion in your nose or some drainage.  This is from the oxygen used during your procedure.  There is no need for concern and it should clear up in a day or so.  SYMPTOMS TO REPORT IMMEDIATELY:   Following lower endoscopy (colonoscopy or flexible sigmoidoscopy):  Excessive amounts of blood in the stool  Significant tenderness or worsening of abdominal pains  Swelling of the abdomen that is new, acute  Fever of 100F or higher  For urgent or emergent issues, a gastroenterologist can be reached at any hour by calling [phone]. Do not use MyChart messaging for urgent concerns.    DIET:  We do recommend a small meal at first, but then you may proceed to your regular diet.  Drink plenty of fluids but you should avoid alcoholic beverages for 24 hours.  ACTIVITY:  You should plan to take it easy for the rest of today and you should NOT  DRIVE, No strenuous activity, no work or use heavy machinery until tomorrow (because of the sedation medicines used during the test).    FOLLOW UP: Our staff will call the number listed on your records Monday 1/24 around 715-8 AM a few days after your procedure to check on you and address any questions or concerns that you may have regarding the information given to you following your procedure. If we do not reach you, we will leave a message.  We will attempt to reach you two times.  During this call, we will ask if you have developed any symptoms of COVID 19. If you develop any symptoms (ie: fever, flu-like symptoms, shortness of breath, cough etc.) before then, please call [phone].  If you test positive for Covid 19 in the 2 weeks post procedure, please call and report this information to [place].    If any biopsies were taken you will be contacted by phone or by letter within the next 1-3 weeks.  Please call us at [phone] if you have not heard about the biopsies in 3 weeks.    SIGNATURES/CONFIDENTIALITY: You and/or your care partner have signed paperwork which will be entered into your electronic medical record.  These signatures attest to the fact that that the information above on your After Visit Summary has been reviewed and is understood.  Full responsibility of the confidentiality of this discharge information lies with you and/or your care-partner.

## 2020-09-28 NOTE — Progress Notes (Signed)
A and O x3. Report to RN. Tolerated MAC anesthesia well.

## 2020-09-28 NOTE — Progress Notes (Signed)
Called to room to assist during endoscopic procedure.  Patient ID and intended procedure confirmed with present staff. Received instructions for my participation in the procedure from the performing physician.  

## 2020-10-02 ENCOUNTER — Telehealth: Payer: Self-pay

## 2020-10-02 NOTE — Telephone Encounter (Signed)
  Follow up Call-  Call back number 09/28/2020  Post procedure Call Back phone  # [phone]  Permission to leave phone message Yes  Some recent data might be hidden     Patient questions:  Do you have a fever, pain , or abdominal swelling? No. Pain Score  0 *  Have you tolerated food without any problems? Yes.    Have you been able to return to your normal activities? Yes.    Do you have any questions about your discharge instructions: Diet   No. Medications  No. Follow up visit  No.  Do you have questions or concerns about your Care? No.  Actions: * If pain score is 4 or above: No action needed, pain <4.  1. Have you developed a fever since your procedure? No  2.   Have you had an respiratory symptoms (SOB or cough) since your procedure? No  3.   Have you tested positive for COVID 19 since your procedure No  4.   Have you had any family members/close contacts diagnosed with the COVID 19 since your procedure?  No   If yes to any of these questions please route to Joylene John, RN and Joella Prince, RN

## 2020-10-04 ENCOUNTER — Encounter: Payer: Self-pay | Admitting: Internal Medicine

## 2020-11-05 NOTE — Progress Notes (Addendum)
Cardiology Office Note:    Date:  11/07/2020   ID:  Eric Dominguez, DOB 1946/05/13, MRN [id number]  PCP:  Marton Redwood, [place]  Cardiologist:  No primary care provider on file.  Advanced Practice Provider:  No care team member to display Electrophysiologist:  None    Referring MD: Marton Redwood, MD    History of Present Illness:    Eric Dominguez is a 75 y.o. male with a hx of HTN, RBBB and first degree AVB, GERD, and hepatitis who was referred by Dr. Brigitte Pulse for evaluation of syncope.  Patient states that he was in [place], [place] on 10/26/20 and while visiting with friends, he tipped his head back to drink his beer and the beer came out little too fast and he felt like his throat closed up and he needed to burp. Right as he was trying to belch, he lost consciousness. Was out for 3-4 seconds prior to coming to. Went to [facility] and they did an ECG which was unchanged from priors and checked a blood glucose which was normal. He was discharged home. The next Tuesday, he again was drinking water and it flowed out too fast and again he syncopized and fell out of the booth to the floor. He was out a couple of seconds. EMS was called and blood pressure was 110s. He did not go to the hospital at that time. Patient denies any prodromal symptoms. No chest pain, lightheadedness, shortness of breath, palpitations. Notably on both occasions he took multiple sildenafil pills (2-3) the day prior in addition to taking his lisinopril.   No prior history of syncope. Patient denies any exertional symptoms or chest pain, SOB, lightheadedness, LE edema, orthopnea, or PND. Patient is active without restrictions.   Past Medical History:  Diagnosis Date  . First degree heart block   . GERD (gastroesophageal reflux disease)    OCCASIONAL ZANTAC AT HS  . Hepatitis 1975  . Hypertension   . Primary localized osteoarthrosis of the knee, right 07/29/2017  . Prostate cancer ([place]) 11/26/12    gleason3+4=7,& 3+3=6,PSA=4.65 volume=42.5cc  . RBBB     Past Surgical History:  Procedure Laterality Date  . CERVICAL DISC SURGERY    . COLONOSCOPY  2016  . CYSTOSCOPY N/A 01/14/2013   Procedure: CYSTOSCOPY FLEXIBLE;  Surgeon: Malka So, MD;  Location: [facility];  Service: Urology;  Laterality: N/A;  no seeds found in bladder  . HARDWARE REMOVAL Right 08/11/2017   Procedure: HARDWARE REMOVAL RIGHT KNEE;  Surgeon: Elsie Saas, MD;  Location: [facility];  Service: Orthopedics;  Laterality: Right;  . LEFT ANKLE SURGERY  2012  . RADIOACTIVE SEED IMPLANT N/A 01/14/2013   Procedure: RADIOACTIVE SEED IMPLANT;  Surgeon: Malka So, MD;  Location: [facility];  Service: Urology;  Laterality: N/A;  68    seeds implanted  . REPAIR TORN BICEP  Right 2012  . TOTAL KNEE ARTHROPLASTY Right 08/11/2017   Procedure: RIGHT TOTAL KNEE ARTHROPLASTY;  Surgeon: Elsie Saas, MD;  Location: [facility];  Service: Orthopedics;  Laterality: Right;    Current Medications: Current Meds  Medication Sig  . acetaminophen (TYLENOL) 325 MG tablet Take 2 tablets (650 mg total) by mouth every 4 (four) hours as needed for mild pain ((score 1 to 3) or temp > 100.5).  Marland Kitchen atorvastatin (LIPITOR) 20 MG tablet Take 20 mg at bedtime by mouth.  . gabapentin (NEURONTIN) 300 MG capsule Take 1 capsule by  mouth 3 (three) times daily.  Marland Kitchen lisinopril (PRINIVIL,ZESTRIL) 20 MG tablet Take 20 mg daily by mouth.   . Menthol, Topical Analgesic, (BIOFREEZE EX) Apply 1 application as needed topically (for pain).     Allergies:   Cedar and Penicillins   Social History   Socioeconomic History  . Marital status: Married    Spouse name: Not on file  . Number of children: 5  . Years of education: Not on file  . Highest education level: Not on file  Occupational History  . 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 Smoking status: Never Smoker  . Smokeless tobacco: Never Used  Vaping Use  .  Vaping Use: Never used  Substance and Sexual Activity  . Alcohol use: Yes    Alcohol/week: 14.0 standard drinks    Types: 14 Standard drinks or equivalent per week    Comment: 1-2 daily  . Drug use: No  . Sexual activity: Not on file  Other Topics Concern  . Not on file  Social History Narrative  . Not on file   Social Determinants of Health   Financial Resource Strain: Not on file  Food Insecurity: Not on file  Transportation Needs: Not on file  Physical Activity: Not on file  Stress: Not on file  Social Connections: Not on file     Family History: The patient's family history includes Cancer in his father. There is no history of Colon cancer, Esophageal cancer, Stomach cancer, Rectal cancer, or Colon polyps.  ROS:   Please see the history of present illness.     Review of Systems  Constitutional: Negative for chills, fever and malaise/fatigue.  HENT: Negative for hearing loss.   Eyes: Negative for blurred vision and redness.  Respiratory: Negative for shortness of breath.   Cardiovascular: Negative for chest pain, palpitations, orthopnea, claudication, leg swelling and PND.  Gastrointestinal: Negative for nausea and vomiting.  Genitourinary: Negative for dysuria and flank pain.  Musculoskeletal: Negative for myalgias.  Neurological: Positive for loss of consciousness. Negative for seizures.  Endo/Heme/Allergies: Negative for polydipsia.  Psychiatric/Behavioral: Negative for substance abuse.    EKGs/Labs/Other Studies Reviewed:    The following studies were reviewed today: No Cardiac Studies  EKG:  EKG is  ordered today.  The ekg ordered today demonstrates sinus bradycardia, RBBB, 1st degree AVB, LAFB  Recent Labs: No results found for requested labs within last 8760 hours.  Recent Lipid Panel    Component Value Date/Time   CHOL 201 (H) 10/13/2009 0855   TRIG 183.0 (H) 10/13/2009 0855   TRIG 155 (H) 09/19/2006 1157   HDL 44.40 10/13/2009 0855   CHOLHDL 5  10/13/2009 0855   VLDL 36.6 10/13/2009 0855   LDLDIRECT 131.9 10/13/2009 0855      Physical Exam:    VS:  BP 128/82   Pulse (!) 51   Ht 6' (1.829 m)   Wt 204 lb 6.4 oz (92.7 kg)   SpO2 96%   BMI 27.72 kg/m     Wt Readings from Last 3 Encounters:  11/07/20 204 lb 6.4 oz (92.7 kg)  09/28/20 202 lb (91.6 kg)  09/13/20 202 lb (91.6 kg)     GEN:  Well nourished, well developed in no acute distress HEENT: Normal NECK: No JVD; No carotid bruits CARDIAC: RRR, no murmurs, rubs, gallops RESPIRATORY:  Clear to auscultation without rales, wheezing or rhonchi  ABDOMEN: Soft, non-tender, non-distended MUSCULOSKELETAL:  No edema; No deformity  SKIN: Warm and dry NEUROLOGIC:  Alert and oriented x 3 PSYCHIATRIC:  Normal affect   ASSESSMENT:    1. Syncope and collapse   2. RBBB   3. Primary hypertension   4. Mixed hyperlipidemia    PLAN:    In order of problems listed above:  #Syncope: Patient with two episodes of syncope. In both instances, the patient was drinking fluid that came out too quickly and made him feel like his throat was closing. Had also taken 2-3 pills of sildenafil the day prior. Was unconscious for a couple of seconds and then returned to baseline mental status. No preceding palpitations, chest pain, dyspnea. No prior history of syncope. Patient is otherwise well with no exertional symptoms and no known cardiac disease or arrhythmias. Had work-up at [facility] which included ECG and BG which was normal. ECG here with sinus brady with RBBB, LAFB and first degree AVB. Suspect vagal mediated syncope, however, want to ensure no arrhythmogenic/cardiac cause.  -Check TTE -Check 30 day monitor -Avoid more than one dose of sildenafil in 24hours -Counseled that he cannot drive for at least 1 month if no recurrence and work-up negative  #RBBB: #LAFB: #First Degree AVB: -Check monitor as above given syncope  #HTN: -Continue lisinopril 20mg   #HLD: -Continue  atorvastatin 20mg  daily  Medication Adjustments/Labs and Tests Ordered: Current medicines are reviewed at length with the patient today.  Concerns regarding medicines are outlined above.  Orders Placed This Encounter  Procedures  . CARDIAC EVENT MONITOR  . EKG 12-Lead  . ECHOCARDIOGRAM COMPLETE   No orders of the defined types were placed in this encounter.   Patient Instructions  Medication Instructions:  Your physician recommends that you continue on your current medications as directed. Please refer to the Current Medication list given to you today.  *If you need a refill on your cardiac medications before your next appointment, please call your pharmacy*   Lab Work: None ordered   If you have labs (blood work) drawn today and your tests are completely normal, you will receive your results only by: Marland Kitchen MyChart Message (if you have MyChart) OR . A paper copy in the mail If you have any lab test that is abnormal or we need to change your treatment, we will call you to review the results.   Testing/Procedures: Your physician has recommended that you wear an event monitor. Event monitors are medical devices that record the heart's electrical activity. Doctors most often [place] these monitors to diagnose arrhythmias. Arrhythmias are problems with the speed or rhythm of the heartbeat. The monitor is a small, portable device. You can wear one while you do your normal daily activities. This is usually used to diagnose what is causing palpitations/syncope (passing out).   Your physician has requested that you have an echocardiogram. Echocardiography is a painless test that uses sound waves to create images of your heart. It provides your doctor with information about the size and shape of your heart and how well your heart's chambers and valves are working. This procedure takes approximately one hour. There are no restrictions for this procedure.   Follow-Up: At [facility], you and your  health needs are our priority.  As part of our continuing mission to provide you with exceptional heart care, we have created designated Provider Care Teams.  These Care Teams include your primary Cardiologist (physician) and Advanced Practice Providers (APPs -  Physician Assistants and Nurse Practitioners) who all work together to provide you with the care you need, when you need  it.  We recommend signing up for the patient portal called "MyChart".  Sign up information is provided on this After Visit Summary.  MyChart is used to connect with patients for Virtual Visits (Telemedicine).  Patients are able to view lab/test results, encounter notes, upcoming appointments, etc.  Non-urgent messages can be sent to your provider as well.   To learn more about what you can do with MyChart, go to [email or web address].    Your next appointment:   3 month(s)  The format for your next appointment:   In Person  Provider:   You may see Dr. Gwyndolyn Kaufman or one of the following Advanced Practice Providers on your designated Care Team:    Richardson Dopp, PA-C  Robbie Lis, [place]    Other Instructions [employer] Cardiac Event Monitor Instructions Your physician has requested you wear your cardiac event monitor for _____ days, (1-30). [employer] may call or text to confirm a shipping address. The monitor will be sent to a land address via UPS. [employer] will not ship a monitor to a PO BOX. It typically takes 3-5 days to receive your monitor after it has been enrolled. [employer] will assist with USPS tracking if your package is delayed. The telephone number for [employer] is [phone]. Once you have received your monitor, please review the enclosed instructions. Instruction tutorials can also be viewed under help and settings on the enclosed cell phone. Your monitor has already been registered assigning a specific monitor serial # to you.  Applying the monitor Remove cell phone from case and  turn it on. The cell phone works as Dealer and needs to be within [employer] of you at all times. The cell phone will need to be charged on a daily basis. We recommend you plug the cell phone into the enclosed charger at your bedside table every night.  Monitor batteries: You will receive two monitor batteries labelled #1 and #2. These are your recorders. Plug battery #2 onto the second connection on the enclosed charger. Keep one battery on the charger at all times. This will keep the monitor battery deactivated. It will also keep it fully charged for when you need to switch your monitor batteries. A small light will be blinking on the battery emblem when it is charging. The light on the battery emblem will remain on when the battery is fully charged.  Open package of a Monitor strip. Insert battery #1 into black hood on strip and gently squeeze monitor battery onto connection as indicated in instruction booklet. Set aside while preparing skin.  Choose location for your strip, vertical or horizontal, as indicated in the instruction booklet. Shave to remove all hair from location. There cannot be any lotions, oils, powders, or colognes on skin where monitor is to be applied. Wipe skin clean with enclosed Saline wipe. Dry skin completely.  Peel paper labeled #1 off the back of the Monitor strip exposing the adhesive. Place the monitor on the chest in the vertical or horizontal position shown in the instruction booklet. One arrow on the monitor strip must be pointing upward. Carefully remove paper labeled #2, attaching remainder of strip to your skin. Try not to create any folds or wrinkles in the strip as you apply it.  Firmly press and release the circle in the center of the monitor battery. You will hear a small beep. This is turning the monitor battery on. The heart emblem on the monitor battery will light up every 5 seconds if the monitor battery  in turned on and connected to the  patient securely. Do not push and hold the circle down as this turns the monitor battery off. The cell phone will locate the monitor battery. A screen will appear on the cell phone checking the connection of your monitor strip. This may read poor connection initially but change to good connection within the next minute. Once your monitor accepts the connection you will hear a series of 3 beeps followed by a climbing crescendo of beeps. A screen will appear on the cell phone showing the two monitor strip placement options. Touch the picture that demonstrates where you applied the monitor strip.  Your monitor strip and battery are waterproof. You are able to shower, bathe, or swim with the monitor on. They just ask you do not submerge deeper than 3 feet underwater. We recommend removing the monitor if you are swimming in a lake, river, or ocean.  Your monitor battery will need to be switched to a fully charged monitor battery approximately once a week. The cell phone will alert you of an action which needs to be made.  On the cell phone, tap for details to reveal connection status, monitor battery status, and cell phone battery status. The green dots indicates your monitor is in good status. A red dot indicates there is something that needs your attention.  To record a symptom, click the circle on the monitor battery. In 30-60 seconds a list of symptoms will appear on the cell phone. Select your symptom and tap save. Your monitor will record a sustained or significant arrhythmia regardless of you clicking the button. Some patients do not feel the heart rhythm irregularities. [employer] will notify us of any serious or critical events.  Refer to instruction booklet for instructions on switching batteries, changing strips, the Do not disturb or Pause features, or any additional questions.  Call [employer] at [phone], to confirm your monitor is transmitting and record your baseline. They  will answer any questions you may have regarding the monitor instructions at that time.  Returning the monitor to [place] all equipment back into blue box. Peel off strip of paper to expose adhesive and close box securely. There is a prepaid UPS shipping label on this box. Drop in a UPS drop box, or at a UPS facility like [employer]. You may also contact [employer] to arrange UPS to pick up monitor package at your home.      Signed, Freada Bergeron, MD  11/07/2020 5:19 PM    [facility]

## 2020-11-07 ENCOUNTER — Encounter: Payer: Self-pay | Admitting: *Deleted

## 2020-11-07 ENCOUNTER — Ambulatory Visit: Payer: Medicare Other | Admitting: Cardiology

## 2020-11-07 ENCOUNTER — Other Ambulatory Visit: Payer: Self-pay

## 2020-11-07 ENCOUNTER — Encounter: Payer: Self-pay | Admitting: Cardiology

## 2020-11-07 VITALS — BP 128/82 | HR 51 | Ht 72.0 in | Wt 204.4 lb

## 2020-11-07 DIAGNOSIS — I10 Essential (primary) hypertension: Secondary | ICD-10-CM | POA: Diagnosis not present

## 2020-11-07 DIAGNOSIS — I4510 Unspecified right bundle-branch block: Secondary | ICD-10-CM | POA: Diagnosis not present

## 2020-11-07 DIAGNOSIS — E782 Mixed hyperlipidemia: Secondary | ICD-10-CM

## 2020-11-07 DIAGNOSIS — R55 Syncope and collapse: Secondary | ICD-10-CM | POA: Diagnosis not present

## 2020-11-07 NOTE — Progress Notes (Signed)
Patient ID: Eric Dominguez, male   DOB: 1946-06-04, 75 y.o.   MRN: [id number] Patient enrolled for [employer] to ship a 30 day cardiac event monitor to his home.

## 2020-11-07 NOTE — Patient Instructions (Signed)
Medication Instructions:  Your physician recommends that you continue on your current medications as directed. Please refer to the Current Medication list given to you today.  *If you need a refill on your cardiac medications before your next appointment, please call your pharmacy*   Lab Work: None ordered   If you have labs (blood work) drawn today and your tests are completely normal, you will receive your results only by: Marland Kitchen MyChart Message (if you have MyChart) OR . A paper copy in the mail If you have any lab test that is abnormal or we need to change your treatment, we will call you to review the results.   Testing/Procedures: Your physician has recommended that you wear an event monitor. Event monitors are medical devices that record the heart's electrical activity. Doctors most often [place] these monitors to diagnose arrhythmias. Arrhythmias are problems with the speed or rhythm of the heartbeat. The monitor is a small, portable device. You can wear one while you do your normal daily activities. This is usually used to diagnose what is causing palpitations/syncope (passing out).   Your physician has requested that you have an echocardiogram. Echocardiography is a painless test that uses sound waves to create images of your heart. It provides your doctor with information about the size and shape of your heart and how well your heart's chambers and valves are working. This procedure takes approximately one hour. There are no restrictions for this procedure.   Follow-Up: At [facility], you and your health needs are our priority.  As part of our continuing mission to provide you with exceptional heart care, we have created designated Provider Care Teams.  These Care Teams include your primary Cardiologist (physician) and Advanced Practice Providers (APPs -  Physician Assistants and Nurse Practitioners) who all work together to provide you with the care you need, when you need it.  We  recommend signing up for the patient portal called "MyChart".  Sign up information is provided on this After Visit Summary.  MyChart is used to connect with patients for Virtual Visits (Telemedicine).  Patients are able to view lab/test results, encounter notes, upcoming appointments, etc.  Non-urgent messages can be sent to your provider as well.   To learn more about what you can do with MyChart, go to [email or web address].    Your next appointment:   3 month(s)  The format for your next appointment:   In Person  Provider:   You may see Dr. Gwyndolyn Kaufman or one of the following Advanced Practice Providers on your designated Care Team:    Richardson Dopp, PA-C  Robbie Lis, [place]    Other Instructions [employer] Cardiac Event Monitor Instructions Your physician has requested you wear your cardiac event monitor for _____ days, (1-30). [employer] may call or text to confirm a shipping address. The monitor will be sent to a land address via UPS. [employer] will not ship a monitor to a PO BOX. It typically takes 3-5 days to receive your monitor after it has been enrolled. [employer] will assist with USPS tracking if your package is delayed. The telephone number for [employer] is [phone]. Once you have received your monitor, please review the enclosed instructions. Instruction tutorials can also be viewed under help and settings on the enclosed cell phone. Your monitor has already been registered assigning a specific monitor serial # to you.  Applying the monitor Remove cell phone from case and turn it on. The cell phone works as Dealer and needs to be  within 10 feet of you at all times. The cell phone will need to be charged on a daily basis. We recommend you plug the cell phone into the enclosed charger at your bedside table every night.  Monitor batteries: You will receive two monitor batteries labelled #1 and #2. These are your recorders. Plug battery #2 onto the  second connection on the enclosed charger. Keep one battery on the charger at all times. This will keep the monitor battery deactivated. It will also keep it fully charged for when you need to switch your monitor batteries. A small light will be blinking on the battery emblem when it is charging. The light on the battery emblem will remain on when the battery is fully charged.  Open package of a Monitor strip. Insert battery #1 into black hood on strip and gently squeeze monitor battery onto connection as indicated in instruction booklet. Set aside while preparing skin.  Choose location for your strip, vertical or horizontal, as indicated in the instruction booklet. Shave to remove all hair from location. There cannot be any lotions, oils, powders, or colognes on skin where monitor is to be applied. Wipe skin clean with enclosed Saline wipe. Dry skin completely.  Peel paper labeled #1 off the back of the Monitor strip exposing the adhesive. Place the monitor on the chest in the vertical or horizontal position shown in the instruction booklet. One arrow on the monitor strip must be pointing upward. Carefully remove paper labeled #2, attaching remainder of strip to your skin. Try not to create any folds or wrinkles in the strip as you apply it.  Firmly press and release the circle in the center of the monitor battery. You will hear a small beep. This is turning the monitor battery on. The heart emblem on the monitor battery will light up every 5 seconds if the monitor battery in turned on and connected to the patient securely. Do not push and hold the circle down as this turns the monitor battery off. The cell phone will locate the monitor battery. A screen will appear on the cell phone checking the connection of your monitor strip. This may read poor connection initially but change to good connection within the next minute. Once your monitor accepts the connection you will hear a series of  3 beeps followed by a climbing crescendo of beeps. A screen will appear on the cell phone showing the two monitor strip placement options. Touch the picture that demonstrates where you applied the monitor strip.  Your monitor strip and battery are waterproof. You are able to shower, bathe, or swim with the monitor on. They just ask you do not submerge deeper than 3 feet underwater. We recommend removing the monitor if you are swimming in a lake, river, or ocean.  Your monitor battery will need to be switched to a fully charged monitor battery approximately once a week. The cell phone will alert you of an action which needs to be made.  On the cell phone, tap for details to reveal connection status, monitor battery status, and cell phone battery status. The green dots indicates your monitor is in good status. A red dot indicates there is something that needs your attention.  To record a symptom, click the circle on the monitor battery. In 30-60 seconds a list of symptoms will appear on the cell phone. Select your symptom and tap save. Your monitor will record a sustained or significant arrhythmia regardless of you clicking the button. Some patients  do not feel the heart rhythm irregularities. [employer] will notify us of any serious or critical events.  Refer to instruction booklet for instructions on switching batteries, changing strips, the Do not disturb or Pause features, or any additional questions.  Call [employer] at [phone], to confirm your monitor is transmitting and record your baseline. They will answer any questions you may have regarding the monitor instructions at that time.  Returning the monitor to [place] all equipment back into blue box. Peel off strip of paper to expose adhesive and close box securely. There is a prepaid UPS shipping label on this box. Drop in a UPS drop box, or at a UPS facility like [employer]. You may also contact [employer] to arrange  UPS to pick up monitor package at your home.

## 2020-11-13 ENCOUNTER — Ambulatory Visit (INDEPENDENT_AMBULATORY_CARE_PROVIDER_SITE_OTHER): Payer: Medicare Other

## 2020-11-13 DIAGNOSIS — R55 Syncope and collapse: Secondary | ICD-10-CM | POA: Diagnosis not present

## 2020-11-23 ENCOUNTER — Telehealth: Payer: Self-pay | Admitting: Cardiology

## 2020-11-23 NOTE — Telephone Encounter (Signed)
Patient states he is out of town in [place]. He states he and his wife flew there, but while they were there they bought a car so they plan to drive it back to [place]. He would like to confirm that he is eligible to drive long distance.  He states he has been doing well and he assumes he is cleared, but he would like to speak with Dr. Johney Frame just to make sure.  They plan on leaving [place] today.  Please advise.

## 2020-11-23 NOTE — Telephone Encounter (Signed)
Called patient back to get more details. Patient had a syncopal episode over about a month ago and he is wanting to know if he can drive. Talked to Dr. Johney Frame and she stated he is not to drive for at least 3 months. She is waiting to get his monitor back to see if anything shows up on the monitor. Dr. Johney Frame stated he is not to drive at this time. Called patient back to let him know. Patient verbalized understanding.

## 2020-12-01 ENCOUNTER — Ambulatory Visit (HOSPITAL_COMMUNITY): Payer: Medicare Other | Attending: Cardiology

## 2020-12-01 ENCOUNTER — Other Ambulatory Visit: Payer: Self-pay

## 2020-12-01 DIAGNOSIS — R55 Syncope and collapse: Secondary | ICD-10-CM | POA: Diagnosis not present

## 2020-12-01 LAB — ECHOCARDIOGRAM COMPLETE
Area-P 1/2: 1.63 cm2
S' Lateral: 2.7 cm

## 2020-12-04 ENCOUNTER — Telehealth: Payer: Self-pay | Admitting: Cardiology

## 2020-12-04 NOTE — Progress Notes (Unsigned)
RN spoke with the patient and discussed his results, he stated he understood the results. Pt stated he wanted the RN to ask Dr. Johney Frame if he could drive, RN stated she would look into his request.  RN reviewed the chart and this request was asked 2 weeks ago per notes, and Dr. Johney Frame stated could not drive for 3 months.  RN discussed with Dr. Johney Frame and she stated she could not clear him to drive until his monitor was sent back and she could review the results. Patient asked if he could send his monitor in early, and RN stated that he needed to follow the instructions and only return it when instructed to do per Dr. Jacolyn Reedy and monitor orders. Patient stated that he understood this and had no other questions or concerns.  Manuela Schwartz RN

## 2020-12-04 NOTE — Telephone Encounter (Signed)
Follow up:    Patient returning a call concering results.

## 2021-02-05 NOTE — Progress Notes (Signed)
Cardiology Office Note:    Date:  02/09/2021   ID:  Eric Dominguez, DOB Feb 10, 1946, MRN [id number]  PCP:  Ginger Organ., MD   [facility]  Cardiologist:  None  Advanced Practice Provider:  No care team member to display Electrophysiologist:  None    Referring MD: Ginger Organ., MD    History of Present Illness:    Eric Dominguez is a 75 y.o. male with a hx of HTN, RBBB and first degree AVB, GERD, and hepatitis who presents to clinic for follow-up of syncope.  Patient was in [place], [place] on 10/26/20 and while visiting with friends, he tipped his head back to drink his beer and the beer came out little too fast and he felt like his throat closed up and he needed to burp. Right as he was trying to belch, he lost consciousness. Was out for 3-4 seconds prior to coming to. Went to [employer] and they did an ECG which was unchanged from priors and checked a blood glucose which was normal. He was discharged home. The next Tuesday, he again was drinking water and it flowed out too fast and again he syncopized and fell out of the booth to the floor. He was out a couple of seconds. EMS was called and blood pressure was 110s. He did not go to the hospital at that time. Patient denies any prodromal symptoms. No chest pain, lightheadedness, shortness of breath, palpitations. Notably on both occasions he took multiple sildenafil pills (2-3) the day prior in addition to taking his lisinopril.   During last visit on 11/07/20, he was doing well. TTE 12/01/20 demonstrated LVEF 55-60%, G1DD, normal RV, no significant valve disease. Cardiac monitor 12/14/20 showed NSR, rare ectopy, no arrhythmias.  Today, the patient feels well. He remains very active with no anginal symptoms. Has been traveling a lot which he has enjoyed. No lightheadedness, dizziness, or syncope. No chest pain, SOB, or palpitations. Blood pressure is well controlled.   Past Medical History:  Diagnosis Date  .  First degree heart block   . GERD (gastroesophageal reflux disease)    OCCASIONAL ZANTAC AT HS  . Hepatitis 1975  . Hypertension   . Primary localized osteoarthrosis of the knee, right 07/29/2017  . Prostate cancer ([place]) 11/26/12   gleason3+4=7,& 3+3=6,PSA=4.65 volume=42.5cc  . RBBB     Past Surgical History:  Procedure Laterality Date  . CERVICAL DISC SURGERY    . COLONOSCOPY  2016  . CYSTOSCOPY N/A 01/14/2013   Procedure: CYSTOSCOPY FLEXIBLE;  Surgeon: Malka So, MD;  Location: [facility];  Service: Urology;  Laterality: N/A;  no seeds found in bladder  . HARDWARE REMOVAL Right 08/11/2017   Procedure: HARDWARE REMOVAL RIGHT KNEE;  Surgeon: Elsie Saas, MD;  Location: [facility];  Service: Orthopedics;  Laterality: Right;  . LEFT ANKLE SURGERY  2012  . RADIOACTIVE SEED IMPLANT N/A 01/14/2013   Procedure: RADIOACTIVE SEED IMPLANT;  Surgeon: Malka So, MD;  Location: [facility];  Service: Urology;  Laterality: N/A;  68    seeds implanted  . REPAIR TORN BICEP  Right 2012  . TOTAL KNEE ARTHROPLASTY Right 08/11/2017   Procedure: RIGHT TOTAL KNEE ARTHROPLASTY;  Surgeon: Elsie Saas, MD;  Location: [facility];  Service: Orthopedics;  Laterality: Right;    Current Medications: Current Meds  Medication Sig  . acetaminophen (TYLENOL) 325 MG tablet Take 2 tablets (650 mg total) by mouth every 4 (four) hours  as needed for mild pain ((score 1 to 3) or temp > 100.5).  Marland Kitchen atorvastatin (LIPITOR) 20 MG tablet Take 20 mg at bedtime by mouth.  Marland Kitchen lisinopril (PRINIVIL,ZESTRIL) 20 MG tablet Take 20 mg daily by mouth.   . Menthol, Topical Analgesic, (BIOFREEZE EX) Apply 1 application as needed topically (for pain).     Allergies:   Cedar, Ice menthol [liniments], Lac bovis, and Penicillins   Social History   Socioeconomic History  . Marital status: Married    Spouse name: Not on file  . Number of children: 5  . Years of education: Not on file  . Highest education  level: Not on file  Occupational History  . 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 Smoking status: Never Smoker  . Smokeless tobacco: Never Used  Vaping Use  . Vaping Use: Never used  Substance and Sexual Activity  . Alcohol use: Yes    Alcohol/week: 14.0 standard drinks    Types: 14 Standard drinks or equivalent per week    Comment: 1-2 daily  . Drug use: No  . Sexual activity: Not on file  Other Topics Concern  . Not on file  Social History Narrative  . Not on file   Social Determinants of Health   Financial Resource Strain: Not on file  Food Insecurity: Not on file  Transportation Needs: Not on file  Physical Activity: Not on file  Stress: Not on file  Social Connections: Not on file     Family History: The patient's family history includes Cancer in his father. There is no history of Colon cancer, Esophageal cancer, Stomach cancer, Rectal cancer, or Colon polyps.  ROS:   Please see the history of present illness.     Review of Systems  Constitutional: Negative for chills, fever and malaise/fatigue.  HENT: Negative for hearing loss.   Eyes: Negative for blurred vision and redness.  Respiratory: Negative for shortness of breath.   Cardiovascular: Negative for chest pain, palpitations, orthopnea, claudication, leg swelling and PND.  Gastrointestinal: Negative for nausea and vomiting.  Genitourinary: Negative for dysuria and flank pain.  Musculoskeletal: Negative for myalgias.  Neurological: Positive for loss of consciousness. Negative for seizures.  Endo/Heme/Allergies: Negative for polydipsia.  Psychiatric/Behavioral: Negative for substance abuse.    EKGs/Labs/Other Studies Reviewed:    The following studies were reviewed today: TTE 28-Dec-2020: 1. Left ventricular ejection fraction, by estimation, is 55 to 60%. The  left ventricle has normal function. The left ventricle has no regional  wall motion abnormalities. There is moderate left  ventricular hypertrophy.  Left ventricular diastolic  parameters are consistent with Grade I diastolic dysfunction (impaired  relaxation).  2. Right ventricular systolic function is normal. The right ventricular  size is normal.  3. The mitral valve is normal in structure. Trivial mitral valve  regurgitation. No evidence of mitral stenosis.  4. The aortic valve is tricuspid. Aortic valve regurgitation is not  visualized. Mild aortic valve sclerosis is present, with no evidence of  aortic valve stenosis.  5. The inferior vena cava is normal in size with greater than 50%  respiratory variability, suggesting right atrial pressure of 3 mmHg.   Cardiac Monitor 12/14/20:  Patient's monitoring period was from 11/13/20-12/12/20.  Predominant rhythm was NSR with average HR 61bpm; ranging from 42-159bpm  Rare PVCs, rare SVE  No significant arrhythmias  Overall, normal cardiac monitor with no significant arrhythmias or pauses detected.     EKG:  EKG not obtained today  Recent Labs: No results found for requested labs within last 8760 hours.  Recent Lipid Panel    Component Value Date/Time   CHOL 201 (H) 10/13/2009 0855   TRIG 183.0 (H) 10/13/2009 0855   TRIG 155 (H) 09/19/2006 1157   HDL 44.40 10/13/2009 0855   CHOLHDL 5 10/13/2009 0855   VLDL 36.6 10/13/2009 0855   LDLDIRECT 131.9 10/13/2009 0855      Physical Exam:    VS:  BP 120/70   Pulse 70   Ht 6' (1.829 m)   Wt 203 lb 12.8 oz (92.4 kg)   SpO2 97%   BMI 27.64 kg/m     Wt Readings from Last 3 Encounters:  02/09/21 203 lb 12.8 oz (92.4 kg)  11/07/20 204 lb 6.4 oz (92.7 kg)  09/28/20 202 lb (91.6 kg)     GEN:  Comfortable, well appearing HEENT: Normal NECK: No JVD; No carotid bruits CARDIAC: RRR, no murmurs RESPIRATORY:  Clear to auscultation without rales, wheezing or rhonchi  ABDOMEN: Soft, non-tender, non-distended MUSCULOSKELETAL:  No edema; No deformity  SKIN: Warm and dry NEUROLOGIC:  Alert and  oriented x 3 PSYCHIATRIC:  Normal affect   ASSESSMENT:    No diagnosis found. PLAN:    In order of problems listed above:  #Syncope: Patient with two episodes of syncope. In both instances, the patient was drinking fluid that came out too quickly and made him feel like his throat was closing. Had also taken 2-3 pills of sildenafil the day prior. Was unconscious for a couple of seconds and then returned to baseline mental status. No preceding palpitations, chest pain, dyspnea. TTE with normal LVEF, no valvular abnormalities. Cardiac monitor normal with no arrhythmias or pauses. -Reassuring work-up -Discussed avoiding taking >1 dose of sildenafil in 1 day  #RBBB: #LAFB: #First Degree AVB: Cardiac monitor normal with no block. -Continue to monitor  #HTN: Well controlled and at goal <120s/80s. -Continue lisinopril 20mg   #HLD: Well controlled with LDL 70 (goal <100) in 02/2020. -Continue atorvastatin 20mg  daily -Repeat cholesterol per PCP  Medication Adjustments/Labs and Tests Ordered: Current medicines are reviewed at length with the patient today.  Concerns regarding medicines are outlined above.  No orders of the defined types were placed in this encounter.  No orders of the defined types were placed in this encounter.   Patient Instructions  Medication Instructions:  Your physician recommends that you continue on your current medications as directed. Please refer to the Current Medication list given to you today.  *If you need a refill on your cardiac medications before your next appointment, please call your pharmacy*   Lab Work: None  If you have labs (blood work) drawn today and your tests are completely normal, you will receive your results only by: Marland Kitchen MyChart Message (if you have MyChart) OR . A paper copy in the mail If you have any lab test that is abnormal or we need to change your treatment, we will call you to review the  results.   Testing/Procedures: None   Follow-Up: At [facility], you and your health needs are our priority.  As part of our continuing mission to provide you with exceptional heart care, we have created designated Provider Care Teams.  These Care Teams include your primary Cardiologist (physician) and Advanced Practice Providers (APPs -  Physician Assistants and Nurse Practitioners) who all work together to provide you with the care you need, when you need it.  We recommend signing up for the patient portal called "  MyChart".  Sign up information is provided on this After Visit Summary.  MyChart is used to connect with patients for Virtual Visits (Telemedicine).  Patients are able to view lab/test results, encounter notes, upcoming appointments, etc.  Non-urgent messages can be sent to your provider as well.   To learn more about what you can do with MyChart, go to [email or web address].    Your next appointment:   12 month(s)  The format for your next appointment:   In Person  Provider:   Gwyndolyn Kaufman, MD   Other Instructions None     Signed, Freada Bergeron, MD  02/09/2021 10:19 AM    [place]

## 2021-02-09 ENCOUNTER — Other Ambulatory Visit: Payer: Self-pay

## 2021-02-09 ENCOUNTER — Ambulatory Visit: Payer: Medicare Other | Admitting: Cardiology

## 2021-02-09 ENCOUNTER — Encounter: Payer: Self-pay | Admitting: Cardiology

## 2021-02-09 VITALS — BP 120/70 | HR 70 | Ht 72.0 in | Wt 203.8 lb

## 2021-02-09 DIAGNOSIS — I10 Essential (primary) hypertension: Secondary | ICD-10-CM | POA: Diagnosis not present

## 2021-02-09 DIAGNOSIS — R55 Syncope and collapse: Secondary | ICD-10-CM

## 2021-02-09 DIAGNOSIS — E782 Mixed hyperlipidemia: Secondary | ICD-10-CM | POA: Diagnosis not present

## 2021-02-09 DIAGNOSIS — I4510 Unspecified right bundle-branch block: Secondary | ICD-10-CM

## 2021-02-09 NOTE — Patient Instructions (Signed)
Medication Instructions:  Your physician recommends that you continue on your current medications as directed. Please refer to the Current Medication list given to you today.  *If you need a refill on your cardiac medications before your next appointment, please call your pharmacy*   Lab Work: None  If you have labs (blood work) drawn today and your tests are completely normal, you will receive your results only by: Marland Kitchen MyChart Message (if you have MyChart) OR . A paper copy in the mail If you have any lab test that is abnormal or we need to change your treatment, we will call you to review the results.   Testing/Procedures: None   Follow-Up: At [facility], you and your health needs are our priority.  As part of our continuing mission to provide you with exceptional heart care, we have created designated Provider Care Teams.  These Care Teams include your primary Cardiologist (physician) and Advanced Practice Providers (APPs -  Physician Assistants and Nurse Practitioners) who all work together to provide you with the care you need, when you need it.  We recommend signing up for the patient portal called "MyChart".  Sign up information is provided on this After Visit Summary.  MyChart is used to connect with patients for Virtual Visits (Telemedicine).  Patients are able to view lab/test results, encounter notes, upcoming appointments, etc.  Non-urgent messages can be sent to your provider as well.   To learn more about what you can do with MyChart, go to [email or web address].    Your next appointment:   12 month(s)  The format for your next appointment:   In Person  Provider:   Gwyndolyn Kaufman, MD   Other Instructions None

## 2021-05-02 ENCOUNTER — Other Ambulatory Visit: Payer: Self-pay | Admitting: Internal Medicine

## 2021-05-02 DIAGNOSIS — E785 Hyperlipidemia, unspecified: Secondary | ICD-10-CM

## 2021-06-04 ENCOUNTER — Ambulatory Visit
Admission: RE | Admit: 2021-06-04 | Discharge: 2021-06-04 | Disposition: A | Discharge status: Home / Self Care | Payer: Medicare Other | Source: Ambulatory Visit | Attending: Internal Medicine | Admitting: Internal Medicine

## 2021-06-04 DIAGNOSIS — E785 Hyperlipidemia, unspecified: Secondary | ICD-10-CM

## 2021-10-21 IMAGING — CT CT CARDIAC CORONARY ARTERY CALCIUM SCORE
3 series · 14 of 20 positions shown, 16 images · non-contrast
Comparison: None.

CLINICAL DATA: 75-year-old Caucasian male with history of
hyperlipidemia and hypertension.

EXAM:
CT CARDIAC CORONARY ARTERY CALCIUM SCORE
TECHNIQUE: Non-contrast imaging through the heart was performed using
prospective ECG gating. Image post processing was performed on an
independent workstation, allowing for quantitative analysis of the
heart and coronary arteries. Note that this exam targets the heart
and the chest was not imaged in its entirety.

[Series 2: calcium scoring 2.00 qr36 bestdiast 71% hrt calciu · axial · 0.37mm/px · z∈[+1611,+1695]mm · 4 of 70 slices shown]
[im 14/70  vessel]
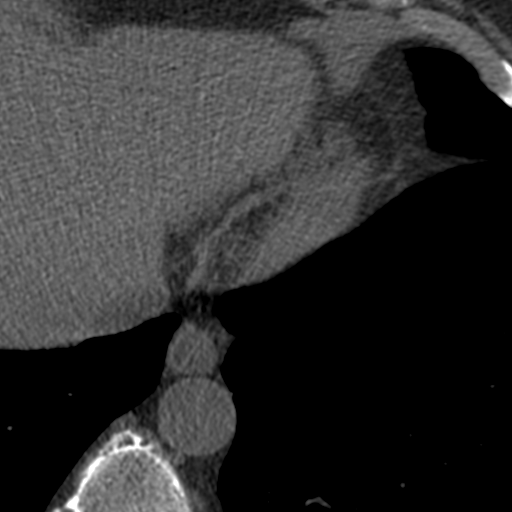
[im 28/70  vessel]
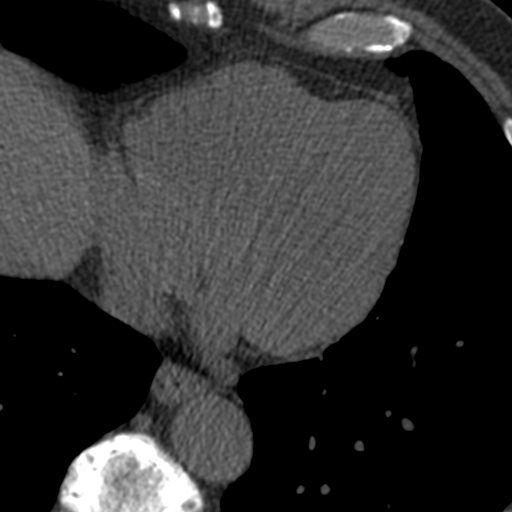
[im 42/70  vessel]
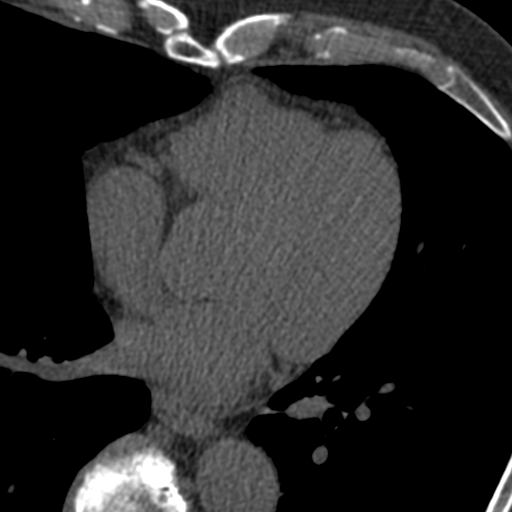
[im 56/70  vessel]
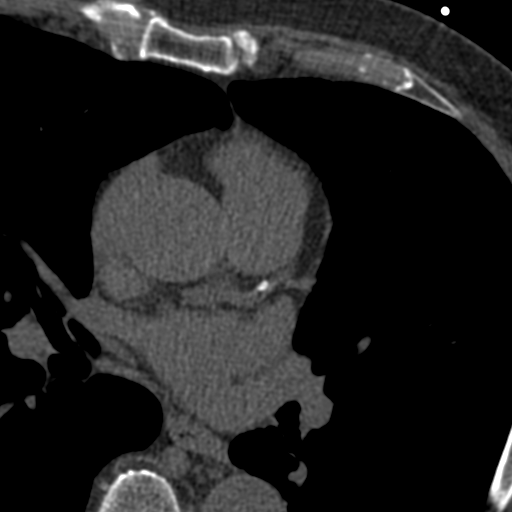

[Series 3: calcium scoring 2.00 br40 bestdiast 71% axial · axial · 0.67mm/px · z∈[+1607,+1699]mm · 5 of 70 slices shown, 7 images]
[im 12/70  vessel]
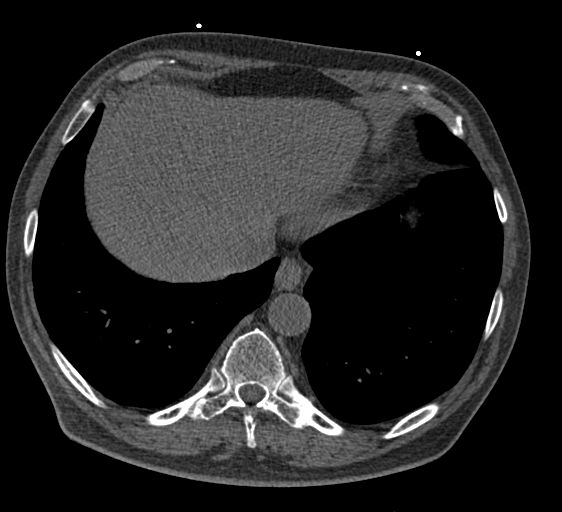
[im 12/70  lung]
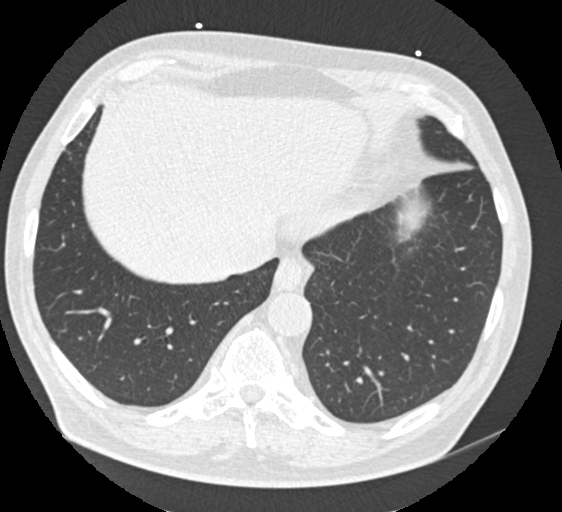
[im 24/70  vessel]
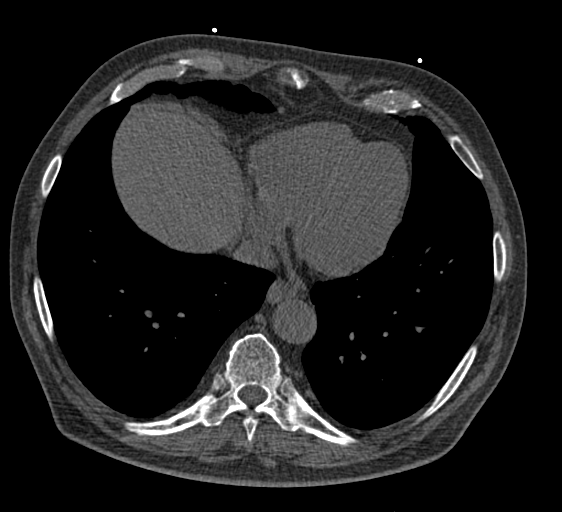
[im 35/70  vessel]
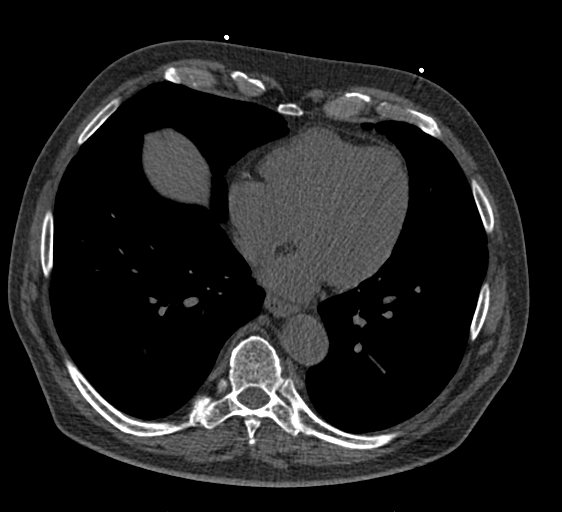
[im 47/70  vessel]
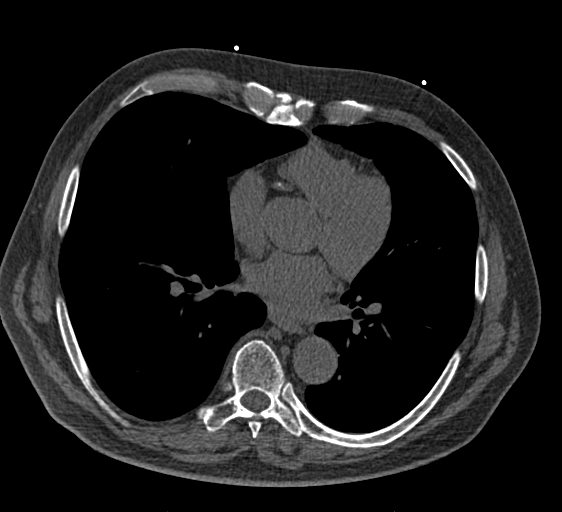
[im 58/70  vessel]
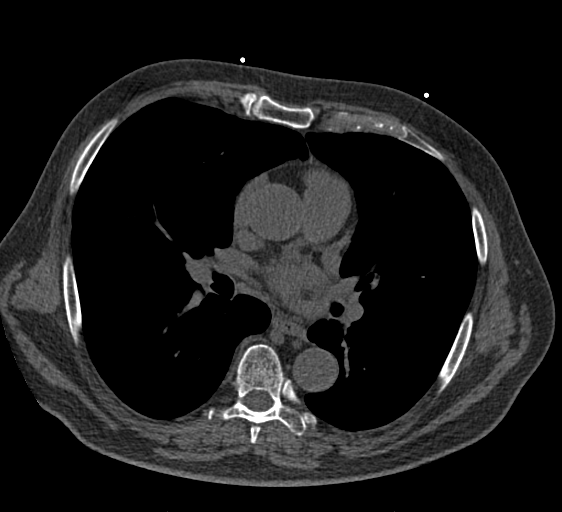
[im 58/70  lung]
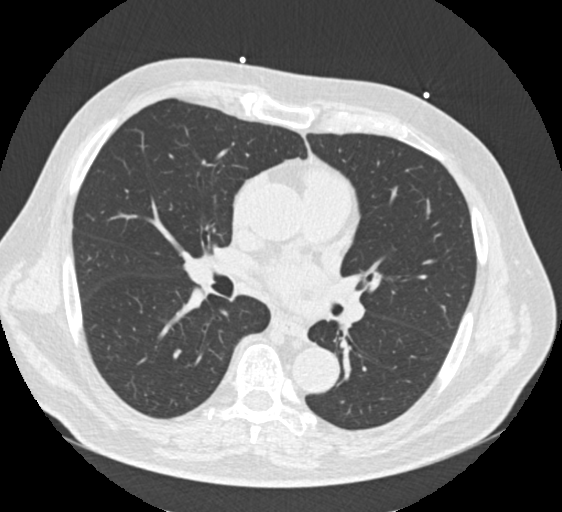

[Series 9: calcium scoring 2.00 br60 bestdiast 71% lungs · axial · 0.67mm/px · z∈[+1607,+1699]mm · 5 of 70 slices shown]
[im 12/70  vessel]
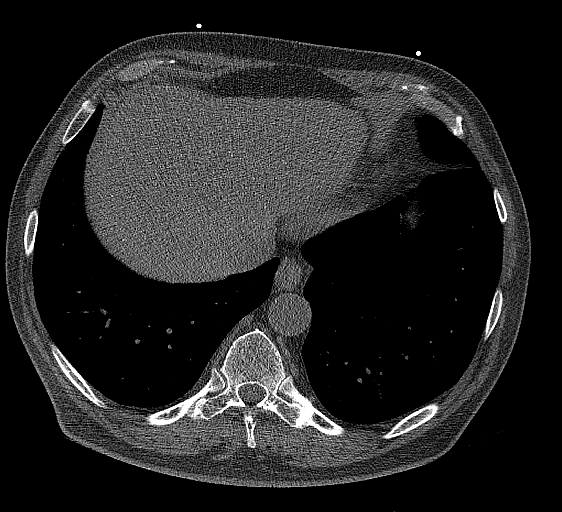
[im 24/70  vessel]
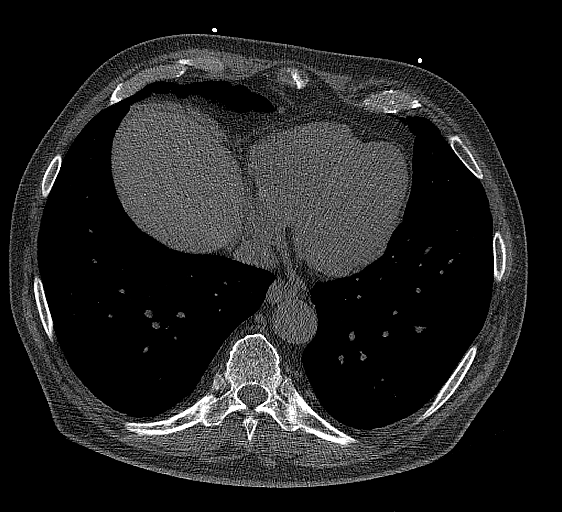
[im 35/70  vessel]
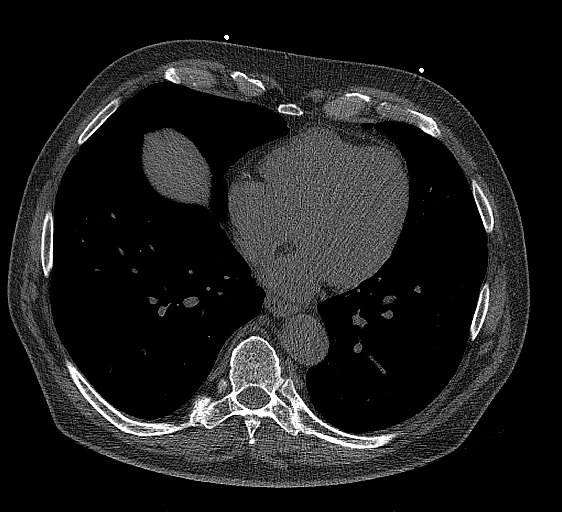
[im 47/70  vessel]
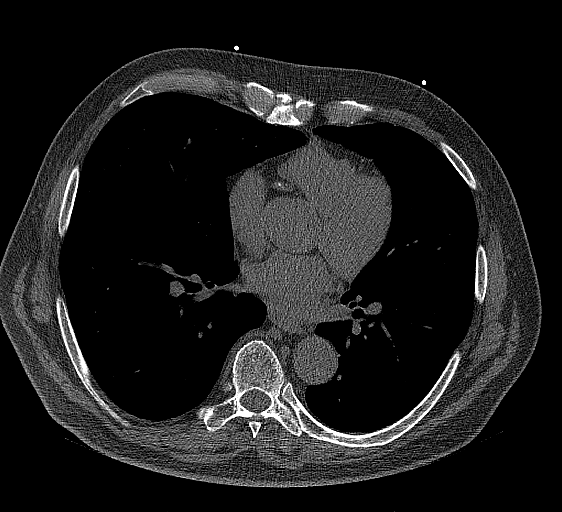
[im 58/70  vessel]
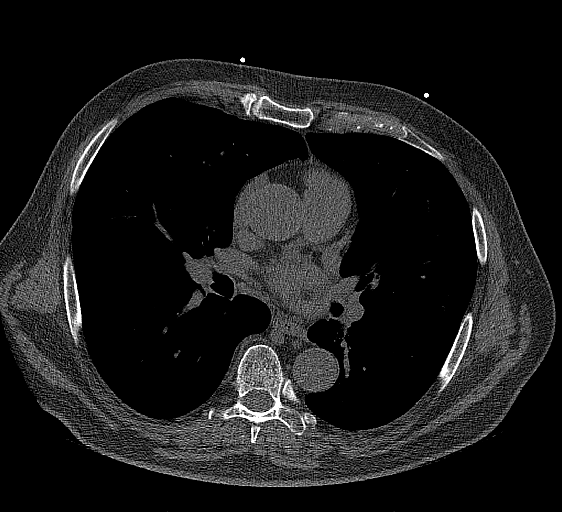

[14 of 20 positions shown; findings below may reference images not displayed]

FINDINGS: CORONARY CALCIUM SCORES:

Left Main: 0

LAD:

LCx: 0

RCA:

Total Agatston Score:

[HOSPITAL] percentile: 35

AORTA MEASUREMENTS:

Ascending Aorta: 37 mm

Descending Aorta: 28 mm

OTHER FINDINGS:

The heart size is within normal limits. No pericardial fluid is
identified. Visualized segments of the thoracic aorta and central
pulmonary arteries are normal in caliber. Visualized mediastinum and
hilar regions demonstrate no lymphadenopathy or masses. Visualized
lungs show no evidence of pulmonary edema, consolidation,
pneumothorax, nodule or pleural fluid. Visualized upper abdomen and
bony structures are unremarkable.
IMPRESSION: Coronary calcium score of 107.6 is at the 35th percentile for the
patient's age, sex and race.

## 2022-06-06 NOTE — Progress Notes (Unsigned)
Cardiology Office Note:    Date:  06/06/2022   ID:  Eric Dominguez, DOB 05/30/1946, MRN [id number]  PCP:  Ginger Organ., MD   [facility]  Cardiologist:  None  Advanced Practice Provider:  No care team member to display Electrophysiologist:  None    Referring MD: Ginger Organ., MD    History of Present Illness:    Eric Dominguez is a 76 y.o. male with a hx of HTN, RBBB and first degree AVB, GERD, and hepatitis who presents to clinic for follow-up.  Patient was in [place], [place] on 10/26/20 and while visiting with friends, he tipped his head back to drink his beer and the beer came out little too fast and he felt like his throat closed up and he needed to burp. Right as he was trying to belch, he lost consciousness. Was out for 3-4 seconds prior to coming to. Went to [employer] and they did an ECG which was unchanged from priors and checked a blood glucose which was normal. He was discharged home. The next Tuesday, he again was drinking water and it flowed out too fast and again he syncopized and fell out of the booth to the floor. He was out a couple of seconds. EMS was called and blood pressure was 110s. He did not go to the hospital at that time. Patient denies any prodromal symptoms. No chest pain, lightheadedness, shortness of breath, palpitations. Notably on both occasions he took multiple sildenafil pills (2-3) the day prior in addition to taking his lisinopril.   During visit on 11/07/20, he was doing well. TTE 12/01/20 demonstrated LVEF 55-60%, G1DD, normal RV, no significant valve disease. Cardiac monitor 12/14/20 showed NSR, rare ectopy, no arrhythmias.  Was last seen on 02/09/21 where he was doing well from a CV standpoint.   Today, ***  Past Medical History:  Diagnosis Date   First degree heart block    GERD (gastroesophageal reflux disease)    OCCASIONAL ZANTAC AT HS   Hepatitis 1975   Hypertension    Primary localized osteoarthrosis of the  knee, right 07/29/2017   Prostate cancer ([place]) 11/26/12   gleason3+4=7,& 3+3=6,PSA=4.65 volume=42.5cc   RBBB     Past Surgical History:  Procedure Laterality Date   CERVICAL DISC SURGERY     COLONOSCOPY  2016   CYSTOSCOPY N/A 01/14/2013   Procedure: CYSTOSCOPY FLEXIBLE;  Surgeon: Malka So, MD;  Location: [facility];  Service: Urology;  Laterality: N/A;  no seeds found in bladder   HARDWARE REMOVAL Right 08/11/2017   Procedure: HARDWARE REMOVAL RIGHT KNEE;  Surgeon: Elsie Saas, MD;  Location: [place];  Service: Orthopedics;  Laterality: Right;   LEFT ANKLE SURGERY  2012   RADIOACTIVE SEED IMPLANT N/A 01/14/2013   Procedure: RADIOACTIVE SEED IMPLANT;  Surgeon: Malka So, MD;  Location: [facility];  Service: Urology;  Laterality: N/A;  68    seeds implanted   REPAIR TORN BICEP  Right 2012   TOTAL KNEE ARTHROPLASTY Right 08/11/2017   Procedure: RIGHT TOTAL KNEE ARTHROPLASTY;  Surgeon: Elsie Saas, MD;  Location: [place];  Service: Orthopedics;  Laterality: Right;    Current Medications: No outpatient medications have been marked as taking for the 06/12/22 encounter (Appointment) with Freada Bergeron, MD.     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   Tobacco Use   Smoking status: Never   Smokeless tobacco: Never  Vaping Use   Vaping Use: Never used  Substance and Sexual Activity   Alcohol use: Yes    Alcohol/week: 14.0 standard drinks of alcohol    Types: 14 Standard drinks or equivalent per week    Comment: 1-2 daily   Drug use: No   Sexual activity: Not on file  Other Topics Concern   Not on file  Social History Narrative   Not on file   Social  Determinants of Health   Financial Resource Strain: Not on file  Food Insecurity: Not on file  Transportation Needs: Not on file  Physical Activity: Not on file  Stress: Not on file  Social Connections: Not on file     Family History: The patient's family history includes Cancer in his father. There is no history of Colon cancer, Esophageal cancer, Stomach cancer, Rectal cancer, or Colon polyps.  ROS:   Please see the history of present illness.     Review of Systems  Constitutional:  Negative for chills, fever and malaise/fatigue.  HENT:  Negative for hearing loss.   Eyes:  Negative for blurred vision and redness.  Respiratory:  Negative for shortness of breath.   Cardiovascular:  Negative for chest pain, palpitations, orthopnea, claudication, leg swelling and PND.  Gastrointestinal:  Negative for nausea and vomiting.  Genitourinary:  Negative for dysuria and flank pain.  Musculoskeletal:  Negative for myalgias.  Neurological:  Positive for loss of consciousness. Negative for seizures.  Endo/Heme/Allergies:  Negative for polydipsia.  Psychiatric/Behavioral:  Negative for substance abuse.     EKGs/Labs/Other Studies Reviewed:    The following studies were reviewed today: TTE 2020/12/09:  1. Left ventricular ejection fraction, by estimation, is 55 to 60%. The  left ventricle has normal function. The left ventricle has no regional  wall motion abnormalities. There is moderate left ventricular hypertrophy.  Left ventricular diastolic  parameters are consistent with Grade I diastolic dysfunction (impaired  relaxation).   2. Right ventricular systolic function is normal. The right ventricular  size is normal.   3. The mitral valve is normal in structure. Trivial mitral valve  regurgitation. No evidence of mitral stenosis.   4. The aortic valve is tricuspid. Aortic valve regurgitation is not  visualized. Mild aortic valve sclerosis is present, with no evidence of  aortic valve  stenosis.   5. The inferior vena cava is normal in size with greater than 50%  respiratory variability, suggesting right atrial pressure of 3 mmHg.   Cardiac Monitor 12/14/20: Patient's monitoring period was from 11/13/20-12/12/20. Predominant rhythm was NSR with average HR 61bpm; ranging from 42-159bpm Rare PVCs, rare SVE No significant arrhythmias Overall, normal cardiac monitor with no significant arrhythmias or pauses detected.     EKG:  EKG not obtained today  Recent Labs: No results found for requested labs within last 365 days.  Recent Lipid Panel    Component Value Date/Time   CHOL 201 (H) 10/13/2009 0855   TRIG 183.0 (H) 10/13/2009 0855   TRIG 155 (H) 09/19/2006 1157   HDL 44.40 10/13/2009 0855   CHOLHDL 5 10/13/2009 0855   VLDL 36.6 10/13/2009 0855   LDLDIRECT 131.9 10/13/2009 0855      Physical Exam:  VS:  There were no vitals taken for this visit.    Wt Readings from Last 3 Encounters:  02/09/21 203 lb 12.8 oz (92.4 kg)  11/07/20 204 lb 6.4 oz (92.7 kg)  09/28/20 202 lb (91.6 kg)     GEN:  Comfortable, well appearing HEENT: Normal NECK: No JVD; No carotid bruits CARDIAC: RRR, no murmurs RESPIRATORY:  Clear to auscultation without rales, wheezing or rhonchi  ABDOMEN: Soft, non-tender, non-distended MUSCULOSKELETAL:  No edema; No deformity  SKIN: Warm and dry NEUROLOGIC:  Alert and oriented x 3 PSYCHIATRIC:  Normal affect   ASSESSMENT:    No diagnosis found. PLAN:    In order of problems listed above:  #Syncope: Patient with two episodes of syncope. In both instances, the patient was drinking fluid that came out too quickly and made him feel like his throat was closing. Had also taken 2-3 pills of sildenafil the day prior. Was unconscious for a couple of seconds and then returned to baseline mental status. No preceding palpitations, chest pain, dyspnea. TTE with normal LVEF, no valvular abnormalities. Cardiac monitor normal with no arrhythmias or  pauses. -Reassuring work-up -Discussed avoiding taking >1 dose of sildenafil in 1 day  #RBBB: #LAFB: #First Degree AVB: Cardiac monitor normal with no block. -Continue to monitor  #HTN: Well controlled and at goal <120s/80s. -Continue lisinopril '20mg'$   #HLD: Well controlled with LDL 70 (goal <100) in 02/2020. -Continue atorvastatin '20mg'$  daily -Repeat cholesterol per PCP  Medication Adjustments/Labs and Tests Ordered: Current medicines are reviewed at length with the patient today.  Concerns regarding medicines are outlined above.  No orders of the defined types were placed in this encounter.  No orders of the defined types were placed in this encounter.   There are no Patient Instructions on file for this visit.    Signed, Freada Bergeron, MD  06/06/2022 1:57 PM    [place]

## 2022-06-12 ENCOUNTER — Ambulatory Visit: Payer: Medicare Other | Attending: Cardiology | Admitting: Cardiology

## 2022-06-12 ENCOUNTER — Encounter: Payer: Self-pay | Admitting: Cardiology

## 2022-06-12 VITALS — BP 120/70 | HR 68 | Ht 72.0 in | Wt 207.0 lb

## 2022-06-12 DIAGNOSIS — I4510 Unspecified right bundle-branch block: Secondary | ICD-10-CM

## 2022-06-12 DIAGNOSIS — I453 Trifascicular block: Secondary | ICD-10-CM

## 2022-06-12 DIAGNOSIS — I10 Essential (primary) hypertension: Secondary | ICD-10-CM | POA: Diagnosis not present

## 2022-06-12 DIAGNOSIS — E782 Mixed hyperlipidemia: Secondary | ICD-10-CM

## 2022-06-12 DIAGNOSIS — R55 Syncope and collapse: Secondary | ICD-10-CM | POA: Diagnosis not present

## 2022-06-12 NOTE — Progress Notes (Signed)
Cardiology Office Note:    Date:  06/12/2022   ID:  Eric Dominguez, DOB 1946-04-29, MRN [id number]  PCP:  Ginger Organ., MD   [facility]  Cardiologist:  None  Advanced Practice Provider:  No care team member to display Electrophysiologist:  None    Referring MD: Ginger Organ., MD    History of Present Illness:    Eric Dominguez is a 76 y.o. male with a hx of HTN, RBBB and first degree AVB, GERD, and hepatitis who presents to clinic for follow-up.  Patient was in [place], [place] on 10/26/20 and while visiting with friends, he tipped his head back to drink his beer and the beer came out little too fast and he felt like his throat closed up and he needed to burp. Right as he was trying to belch, he lost consciousness. Was out for 3-4 seconds prior to coming to. Went to [employer] [facility] and they did an ECG which was unchanged from priors and checked a blood glucose which was normal. He was discharged home. The next Tuesday, he again was drinking water and it flowed out too fast and again he syncopized and fell out of the booth to the floor. He was out a couple of seconds. EMS was called and blood pressure was 110s. He did not go to the hospital at that time. Patient denies any prodromal symptoms. No chest pain, lightheadedness, shortness of breath, palpitations. Notably on both occasions he took multiple sildenafil pills (2-3) the day prior in addition to taking his lisinopril.   During visit on 11/07/20, he was doing well. TTE 12/01/20 demonstrated LVEF 55-60%, G1DD, normal RV, no significant valve disease. Cardiac monitor 12/14/20 showed NSR, rare ectopy, no arrhythmias.  Was last seen on 02/09/21 where he was doing well from a CV standpoint.   Today, the patient states that there have been no recurrent episodes of syncope. No lightheadedness, chest pain, or shortness of breath.  He has pain in his left ankle, which he has dealt with for years. It usually occurs while  walking or standing. He is able to walk through 18 holes of golf, but does have ankle edema and pain afterwards. Recently he received a cortisone injection which seems to have helped in one area of his left ankle; he subsequently noticed pain localized in a different area of his ankle.  He denies any palpitations, headaches, orthopnea, or PND.   Past Medical History:  Diagnosis Date   First degree heart block    GERD (gastroesophageal reflux disease)    OCCASIONAL ZANTAC AT HS   Hepatitis 1975   Hypertension    Primary localized osteoarthrosis of the knee, right 07/29/2017   Prostate cancer ([place]) 11/26/12   gleason3+4=7,& 3+3=6,PSA=4.65 volume=42.5cc   RBBB     Past Surgical History:  Procedure Laterality Date   CERVICAL DISC SURGERY     COLONOSCOPY  2016   CYSTOSCOPY N/A 01/14/2013   Procedure: CYSTOSCOPY FLEXIBLE;  Surgeon: Malka So, MD;  Location: [facility];  Service: Urology;  Laterality: N/A;  no seeds found in bladder   HARDWARE REMOVAL Right 08/11/2017   Procedure: HARDWARE REMOVAL RIGHT KNEE;  Surgeon: Elsie Saas, MD;  Location: [place];  Service: Orthopedics;  Laterality: Right;   LEFT ANKLE SURGERY  2012   RADIOACTIVE SEED IMPLANT N/A 01/14/2013   Procedure: RADIOACTIVE SEED IMPLANT;  Surgeon: Malka So, MD;  Location: [facility];  Service: Urology;  Laterality: N/A;  68    seeds implanted   REPAIR TORN BICEP  Right 2012   TOTAL KNEE ARTHROPLASTY Right 08/11/2017   Procedure: RIGHT TOTAL KNEE ARTHROPLASTY;  Surgeon: Elsie Saas, MD;  Location: [place];  Service: Orthopedics;  Laterality: Right;    Current Medications: Current Meds  Medication Sig   acetaminophen (TYLENOL) 325 MG tablet Take 2 tablets (650 mg total) by mouth every 4 (four) hours as needed for mild pain ((score 1 to 3) or temp > 100.5).   atorvastatin (LIPITOR) 20 MG tablet Take 20 mg at bedtime by mouth.   lisinopril (PRINIVIL,ZESTRIL) 20 MG tablet Take 20 mg daily  by mouth.    Menthol, Topical Analgesic, (BIOFREEZE EX) Apply 1 application as needed topically (for pain).     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   Tobacco Use   Smoking status: Never   Smokeless tobacco: Never  Vaping Use   Vaping Use: Never used  Substance and Sexual Activity   Alcohol use: Yes    Alcohol/week: 14.0 standard drinks of alcohol    Types: 14 Standard drinks or equivalent per week    Comment: 1-2 daily   Drug use: No   Sexual activity: Not on file  Other Topics Concern   Not on file  Social History Narrative   Not on file   Social Determinants of Health   Financial Resource Strain: Not on file  Food Insecurity: Not on file  Transportation Needs: Not on file  Physical Activity: Not on file  Stress: Not on file  Social Connections: Not on file     Family History: The patient's family history includes Cancer in his father. There is no history of Colon cancer, Esophageal cancer, Stomach cancer, Rectal cancer, or Colon polyps.  ROS:   Please see the history of present illness.     Review of Systems  Constitutional:  Negative for chills, fever and malaise/fatigue.  HENT:  Negative for hearing loss.   Eyes:  Negative for blurred vision and redness.  Respiratory:  Negative for shortness of breath.   Cardiovascular:  Positive for leg swelling (Left ankle). Negative for chest pain, palpitations, orthopnea, claudication and PND.  Gastrointestinal:  Negative for nausea and vomiting.  Genitourinary:  Negative for dysuria and flank pain.  Musculoskeletal:  Positive for joint pain (Left ankle). Negative for myalgias.  Neurological:  Negative for seizures and loss of  consciousness.  Endo/Heme/Allergies:  Negative for polydipsia.  Psychiatric/Behavioral:  Negative for substance abuse.     EKGs/Labs/Other Studies Reviewed:    The following studies were reviewed today:  CT Calcium Score  06/04/2021: FINDINGS: CORONARY CALCIUM SCORES:   Left Main: 0   LAD: 97.5   LCx: 0   RCA: 10.1   Total Agatston Score: 107.6   MESA database percentile: 35   AORTA MEASUREMENTS:   Ascending Aorta: 37 mm   Descending Aorta: 28 mm   OTHER FINDINGS:   The heart size is within normal limits. No pericardial fluid is identified. Visualized segments of the thoracic aorta and central pulmonary arteries are normal in caliber. Visualized mediastinum and hilar regions demonstrate no lymphadenopathy or masses. Visualized  lungs show no evidence of pulmonary edema, consolidation, pneumothorax, nodule or pleural fluid. Visualized upper abdomen and bony structures are unremarkable.   IMPRESSION: Coronary calcium score of 107.6 is at the 35th percentile for the patient's age, sex and race.  Cardiac Monitor 12/2020: Patient's monitoring period was from 11/13/20-12/12/20. Predominant rhythm was NSR with average HR 61bpm; ranging from 42-159bpm Rare PVCs, rare SVE No significant arrhythmias Overall, normal cardiac monitor with no significant arrhythmias or pauses detected.  TTE 12/01/20:  1. Left ventricular ejection fraction, by estimation, is 55 to 60%. The  left ventricle has normal function. The left ventricle has no regional  wall motion abnormalities. There is moderate left ventricular hypertrophy.  Left ventricular diastolic  parameters are consistent with Grade I diastolic dysfunction (impaired  relaxation).   2. Right ventricular systolic function is normal. The right ventricular  size is normal.   3. The mitral valve is normal in structure. Trivial mitral valve  regurgitation. No evidence of mitral stenosis.   4. The aortic valve is tricuspid.  Aortic valve regurgitation is not  visualized. Mild aortic valve sclerosis is present, with no evidence of  aortic valve stenosis.   5. The inferior vena cava is normal in size with greater than 50%  respiratory variability, suggesting right atrial pressure of 3 mmHg.    EKG:  EKG is personally reviewed. 06/12/2022:  Sinus rhythm. RBBB. LAFB. Rate 68 bpm.   Recent Labs: No results found for requested labs within last 365 days.   Recent Lipid Panel    Component Value Date/Time   CHOL 201 (H) 10/13/2009 0855   TRIG 183.0 (H) 10/13/2009 0855   TRIG 155 (H) 09/19/2006 1157   HDL 44.40 10/13/2009 0855   CHOLHDL 5 10/13/2009 0855   VLDL 36.6 10/13/2009 0855   LDLDIRECT 131.9 10/13/2009 0855      Physical Exam:    VS:  BP 120/70   Pulse 68   Ht 6' (1.829 m)   Wt 207 lb (93.9 kg)   SpO2 94%   BMI 28.07 kg/m     Wt Readings from Last 3 Encounters:  06/12/22 207 lb (93.9 kg)  02/09/21 203 lb 12.8 oz (92.4 kg)  11/07/20 204 lb 6.4 oz (92.7 kg)     GEN:  Comfortable, NAD HEENT: Normal NECK: No JVD; No carotid bruits CARDIAC: RRR, 2/6 systolic murmur, No rubs, No gallops RESPIRATORY:  Clear to auscultation without rales, wheezing or rhonchi  ABDOMEN: Soft, non-tender, non-distended MUSCULOSKELETAL:  No edema; No deformity  SKIN: Warm and dry NEUROLOGIC:  Alert and oriented x 3 PSYCHIATRIC:  Normal affect   ASSESSMENT:    1. Syncope and collapse   2. Mixed hyperlipidemia   3. Primary hypertension   4. RBBB   5. Trifascicular block    PLAN:    In order of problems listed above:  #Syncope: Patient with two episodes of syncope. In both instances, the patient was drinking fluid that came out too quickly and made him feel like his throat was closing. Had also taken 2-3 pills of sildenafil the day prior. Was unconscious for a couple of seconds and then returned to baseline mental status. No preceding palpitations, chest pain, dyspnea. TTE with normal LVEF, no valvular  abnormalities. Cardiac monitor normal with no arrhythmias or pauses. -Reassuring work-up -Discussed avoiding taking >1 dose of sildenafil in 1 day  #RBBB: #LAFB: #First Degree AVB: Cardiac monitor normal with no block. -Continue to monitor  #HTN: Well controlled and at goal <120s/80s. -  Continue lisinopril '20mg'$   #HLD: Well controlled with LDL 64 04/2022 -Continue atorvastatin '20mg'$  daily -Repeat cholesterol per PCP  Follow-up:  1 year.  Medication Adjustments/Labs and Tests Ordered: Current medicines are reviewed at length with the patient today.  Concerns regarding medicines are outlined above.   Orders Placed This Encounter  Procedures   EKG 12-Lead   No orders of the defined types were placed in this encounter.  Patient Instructions  Medication Instructions:   Your physician recommends that you continue on your current medications as directed. Please refer to the Current Medication list given to you today.  *If you need a refill on your cardiac medications before your next appointment, please call your pharmacy*   Follow-Up: At [facility], you and your health needs are our priority.  As part of our continuing mission to provide you with exceptional heart care, we have created designated Provider Care Teams.  These Care Teams include your primary Cardiologist (physician) and Advanced Practice Providers (APPs -  Physician Assistants and Nurse Practitioners) who all work together to provide you with the care you need, when you need it.  We recommend signing up for the patient portal called "MyChart".  Sign up information is provided on this After Visit Summary.  MyChart is used to connect with patients for Virtual Visits (Telemedicine).  Patients are able to view lab/test results, encounter notes, upcoming appointments, etc.  Non-urgent messages can be sent to your provider as well.   To learn more about what you can do with MyChart, go to [email or web address].     Your next appointment:   1 year(s)  The format for your next appointment:   In Person  Provider:   DR. Johney Frame   Important Information About [place]        I,Mathew Stumpf,acting as a scribe for Freada Bergeron, MD.,have documented all relevant documentation on the behalf of Freada Bergeron, MD,as directed by  Freada Bergeron, MD while in the presence of Freada Bergeron, MD.  I, Freada Bergeron, MD, have reviewed all documentation for this visit. The documentation on 06/12/22 for the exam, diagnosis, procedures, and orders are all accurate and complete.    Signed, Freada Bergeron, MD  06/12/2022 10:06 AM    [place]

## 2022-06-12 NOTE — Patient Instructions (Signed)
Medication Instructions:   Your physician recommends that you continue on your current medications as directed. Please refer to the Current Medication list given to you today.  *If you need a refill on your cardiac medications before your next appointment, please call your pharmacy*    Follow-Up: At [facility], you and your health needs are our priority.  As part of our continuing mission to provide you with exceptional heart care, we have created designated Provider Care Teams.  These Care Teams include your primary Cardiologist (physician) and Advanced Practice Providers (APPs -  Physician Assistants and Nurse Practitioners) who all work together to provide you with the care you need, when you need it.  We recommend signing up for the patient portal called "MyChart".  Sign up information is provided on this After Visit Summary.  MyChart is used to connect with patients for Virtual Visits (Telemedicine).  Patients are able to view lab/test results, encounter notes, upcoming appointments, etc.  Non-urgent messages can be sent to your provider as well.   To learn more about what you can do with MyChart, go to [email or web address].    Your next appointment:   1 year(s)  The format for your next appointment:   In Person  Provider:   DR. PEMBERTON   Important Information About [place]       

## 2022-07-24 ENCOUNTER — Other Ambulatory Visit (HOSPITAL_COMMUNITY): Payer: Self-pay | Admitting: Urology

## 2022-07-24 DIAGNOSIS — R9721 Rising PSA following treatment for malignant neoplasm of prostate: Secondary | ICD-10-CM

## 2022-07-24 DIAGNOSIS — Z8546 Personal history of malignant neoplasm of prostate: Secondary | ICD-10-CM

## 2022-08-06 ENCOUNTER — Encounter (HOSPITAL_COMMUNITY)
Admission: RE | Admit: 2022-08-06 | Discharge: 2022-08-06 | Disposition: A | Discharge status: Home / Self Care | Payer: Medicare Other | Source: Ambulatory Visit | Attending: Urology | Admitting: Urology

## 2022-08-06 DIAGNOSIS — Z8546 Personal history of malignant neoplasm of prostate: Secondary | ICD-10-CM | POA: Diagnosis present

## 2022-08-06 DIAGNOSIS — R9721 Rising PSA following treatment for malignant neoplasm of prostate: Secondary | ICD-10-CM | POA: Insufficient documentation

## 2022-08-06 MED ORDER — PIFLIFOLASTAT F 18 (PYLARIFY) INJECTION
9.0000 | Freq: Once | INTRAVENOUS | Status: AC
  Administered 2022-08-06: 9.49 via INTRAVENOUS

## 2023-05-02 ENCOUNTER — Telehealth: Payer: Self-pay | Admitting: Cardiology

## 2023-05-02 NOTE — Telephone Encounter (Signed)
Patient called and said that he would like for Dr. Devin Going previous nurse to give him a call. Would like to show appreciation and to see if he could send a letter or message to Dr. Shari Prows

## 2023-05-02 NOTE — Telephone Encounter (Signed)
Pt was calling to ask if there was anyway to get an address to Dr. Devin Going new residence to send her a thank you note.  Informed the pt that I unfortunately I don't have her address at this time, but when he comes in for his next follow-up appt with Dr. Tenny Craw in Oct to establish, I can see if I can get her address by that time to give that to him.  Pt verbalized understanding and agrees with this plan.

## 2023-06-23 ENCOUNTER — Encounter: Payer: Self-pay | Admitting: Internal Medicine

## 2023-06-23 ENCOUNTER — Ambulatory Visit: Payer: Medicare Other | Attending: Internal Medicine | Admitting: Internal Medicine

## 2023-06-23 VITALS — BP 116/72 | HR 64 | Resp 16 | Ht 72.0 in | Wt 212.8 lb

## 2023-06-23 DIAGNOSIS — I4510 Unspecified right bundle-branch block: Secondary | ICD-10-CM

## 2023-06-23 DIAGNOSIS — E782 Mixed hyperlipidemia: Secondary | ICD-10-CM | POA: Diagnosis not present

## 2023-06-23 DIAGNOSIS — I10 Essential (primary) hypertension: Secondary | ICD-10-CM

## 2023-06-23 DIAGNOSIS — I453 Trifascicular block: Secondary | ICD-10-CM

## 2023-06-23 DIAGNOSIS — R55 Syncope and collapse: Secondary | ICD-10-CM

## 2023-06-23 NOTE — Progress Notes (Signed)
Cardiology Office Note:    Date:  06/23/2023   ID:  Eric Dominguez, DOB 1946-08-12, MRN [id number]  PCP:  Cleatis Polka., MD   [facility]  Cardiologist:  None  Advanced Practice Provider:  No care team member to display Electrophysiologist:  None    Referring MD: Cleatis Polka., MD   Pt presents for follow up of CAD   Previously followed by Jon Billings   History of Present Illness:    Eric Dominguez is a 77 y.o. male with a hx of HTN, RBBB and first degree AVB, GERD, and hepatitis who presents to clinic for follow-up.  Patient was in [place], [place] on 10/26/20 and while visiting with friends, he tipped his head back to drink his beer and the beer came out little too fast and he felt like his throat closed up and he needed to burp. Right as he was trying to belch, he lost consciousness. Was out for 3-4 seconds prior to coming to. Went to [employer] and they did an ECG which was unchanged from priors and checked a blood glucose which was normal. He was discharged home. The next Tuesday, he again was drinking water and it flowed out too fast and again he syncopized and fell out of the booth to the floor. He was out a couple of seconds. EMS was called and blood pressure was 110s. He did not go to the hospital at that time. Patient denies any prodromal symptoms. No chest pain, lightheadedness, shortness of breath, palpitations. Notably on both occasions he took multiple sildenafil pills (2-3) the day prior in addition to taking his lisinopril.   During visit on 11/07/20, he was doing well. TTE 12/01/20 demonstrated LVEF 55-60%, G1DD, normal RV, no significant valve disease. Cardiac monitor 12/14/20 showed NSR, rare ectopy, no arrhythmias.  Was last seen on summer 2023 he has done well  Golfing a lot   Got back from recent trip to [place]  He denies CP  Breathing is OK   No dizziness   No palpitations     Stopped Crestor this summer   Felt he was getting weaker    Started  on Zetia in August     Diet Br:  Eggs and bacon  Berries     Lunch/dinner   Veggies, meats,   Minimal carbs    No sugar drinks  Plays golf very often  Golf pro    Past Medical History:  Diagnosis Date   First degree heart block    GERD (gastroesophageal reflux disease)    OCCASIONAL ZANTAC AT HS   Hepatitis 1975   Hypertension    Primary localized osteoarthrosis of the knee, right 07/29/2017   Prostate cancer (HCC) 11/26/12   gleason3+4=7,& 3+3=6,PSA=4.65 volume=42.5cc   RBBB     Past Surgical History:  Procedure Laterality Date   CERVICAL DISC SURGERY     COLONOSCOPY  2016   CYSTOSCOPY N/A 01/14/2013   Procedure: CYSTOSCOPY FLEXIBLE;  Surgeon: [name], MD;  Location: [facility];  Service: Urology;  Laterality: N/A;  no seeds found in bladder   HARDWARE REMOVAL Right 08/11/2017   Procedure: HARDWARE REMOVAL RIGHT KNEE;  Surgeon: Salvatore Marvel, MD;  Location: [facility] OR;  Service: Orthopedics;  Laterality: Right;   LEFT ANKLE SURGERY  2012   RADIOACTIVE SEED IMPLANT N/A 01/14/2013   Procedure: RADIOACTIVE SEED IMPLANT;  Surgeon: [name], MD;  Location: [facility];  Service: Urology;  Laterality: N/A;  68    seeds implanted   REPAIR TORN BICEP  Right 2012   TOTAL KNEE ARTHROPLASTY Right 08/11/2017   Procedure: RIGHT TOTAL KNEE ARTHROPLASTY;  Surgeon: Salvatore Marvel, MD;  Location: [facility] OR;  Service: Orthopedics;  Laterality: Right;    Current Medications: Current Meds  Medication Sig   acetaminophen (TYLENOL) 325 MG tablet Take 2 tablets (650 mg total) by mouth every 4 (four) hours as needed for mild pain ((score 1 to 3) or temp > 100.5).   atorvastatin (LIPITOR) 20 MG tablet Take 20 mg at bedtime by mouth.   ezetimibe (ZETIA) 10 MG tablet Take 10 mg by mouth daily.   lisinopril (PRINIVIL,ZESTRIL) 20 MG tablet Take 20 mg daily by mouth.    Menthol, Topical Analgesic, (BIOFREEZE EX) Apply 1 application as needed topically (for pain).   MYRBETRIQ  50 MG TB24 tablet Take 50 mg by mouth daily.     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   Tobacco Use   Smoking status: Never   Smokeless tobacco: Never  Vaping Use   Vaping status: Never Used  Substance and Sexual Activity   Alcohol use: Yes    Alcohol/week: 14.0 standard drinks of alcohol    Types: 14 Standard drinks or equivalent per week    Comment: 1-2 daily   Drug use: No   Sexual activity: Not on file  Other Topics Concern   Not on file  Social History Narrative   Not on file   Social Determinants of Health   Financial Resource Strain: Not on file  Food Insecurity: Not on file  Transportation Needs: Not on file  Physical Activity: Not on file  Stress: Not on file  Social Connections: Not on file     Family History: The patient's family history includes Cancer in his father. There is no history of Colon cancer, Esophageal cancer, Stomach cancer, Rectal cancer, or Colon polyps.      EKGs/Labs/Other Studies Reviewed:    The following studies were reviewed today:  CT Calcium Score  06/04/2021: FINDINGS: CORONARY CALCIUM SCORES:   Left Main: 0   LAD: 97.5   LCx: 0   RCA: 10.1   Total Agatston Score: 107.6   MESA database percentile: 35   AORTA MEASUREMENTS:   Ascending Aorta: 37 mm   Descending Aorta: 28 mm   OTHER FINDINGS:   The heart size is within normal limits. No pericardial fluid is identified. Visualized segments of the thoracic aorta and central pulmonary arteries are normal in caliber. Visualized mediastinum and hilar regions demonstrate no lymphadenopathy or masses. Visualized lungs show no evidence of pulmonary edema,  consolidation, pneumothorax, nodule or pleural fluid. Visualized upper abdomen and bony structures are unremarkable.   IMPRESSION: Coronary calcium score of 107.6 is at the 35th percentile for the patient's age, sex and race.  Cardiac Monitor 12/2020: Patient's monitoring period was from 11/13/20-12/12/20. Predominant rhythm was NSR with average HR 61bpm; ranging from 42-159bpm Rare PVCs, rare SVE No significant arrhythmias Overall, normal cardiac monitor with no significant arrhythmias or pauses detected.  TTE 12/01/20:  1. Left ventricular ejection fraction, by estimation, is 55 to 60%. The  left ventricle has normal function. The left ventricle has no regional  wall motion abnormalities. There is moderate left ventricular hypertrophy.  Left ventricular diastolic  parameters are consistent with Grade I diastolic dysfunction (impaired  relaxation).   2. Right ventricular systolic function is normal. The right ventricular  size is normal.   3. The mitral valve is normal in structure. Trivial mitral valve  regurgitation. No evidence of mitral stenosis.   4. The aortic valve is tricuspid. Aortic valve regurgitation is not  visualized. Mild aortic valve sclerosis is present, with no evidence of  aortic valve stenosis.   5. The inferior vena cava is normal in size with greater than 50%  respiratory variability, suggesting right atrial pressure of 3 mmHg.    EKG:  EKG is personally reviewed. Sinus bradycardia  First degree AV block   PR 218 msec. RBBB. LAFB. 59 bpm    Recent Labs: No results found for requested labs within last 365 days.   Recent Lipid Panel    Component Value Date/Time   CHOL 201 (H) 10/13/2009 0855   TRIG 183.0 (H) 10/13/2009 0855   TRIG 155 (H) 09/19/2006 1157   HDL 44.40 10/13/2009 0855   CHOLHDL 5 10/13/2009 0855   VLDL 36.6 10/13/2009 0855   LDLDIRECT 131.9 10/13/2009 0855      Physical Exam:    VS:  BP 116/72 (BP Location: Left Arm, Patient  Position: Sitting, Cuff Size: Large)   Pulse 64   Resp 16   Ht 6' (1.829 m)   Wt 212 lb 12.8 oz (96.5 kg)   SpO2 95%   BMI 28.86 kg/m     Wt Readings from Last 3 Encounters:  06/23/23 212 lb 12.8 oz (96.5 kg)  06/12/22 207 lb (93.9 kg)  02/09/21 203 lb 12.8 oz (92.4 kg)     GEN:  Comfortable, NAD HEENT: Normal NECK: No JVD; No carotid bruit CARDIAC: RRR, RESPIRATORY:  Clear to auscultation ABDOMEN: Soft, non-tender, non-distended  No hepatomegaly      ASSESSMENT:    1. Syncope and collapse   2. RBBB   3. Primary hypertension   4. Mixed hyperlipidemia   5. Trifascicular block     #Syncope: Patient with two episodes of syncope. In past   Both occurred with rapid drinking of cold fluid  Also had a couple sildenafil the day prior    Work up negative     No recurrence   Occasional dizziness with stoop to standing     Follow     #RBBB: #LAFB: #First Degree AVB: Cardiac monitor normal with no block. Follow   # CAD  Ca score CT in 2022  Score of 107.6, mainly in LAD No symtpoms of angina     Continue risk factor modification   #HTN: BP is controlled on current regimen     #HLD: Stopped statin due to achiness/weakness Currently on Zetia Will get notes from Dr Alver Fisher office Check lipomed and Lpa and ApoB today   Follow-up:  1 year. Stay active   Medication Adjustments/Labs and Tests Ordered: Current medicines are reviewed at length with the patient today.  Concerns regarding medicines are outlined above.   Orders Placed This Encounter  Procedures   EKG 12-Lead   Signed, Dietrich Pates, MD  06/23/2023 8:59 AM    [facility]

## 2023-06-23 NOTE — Patient Instructions (Signed)
Medication Instructions:   *If you need a refill on your cardiac medications before your next appointment, please call your pharmacy*   Lab Work: NMR, APO B, LIPO A  If you have labs (blood work) drawn today and your tests are completely normal, you will receive your results only by: MyChart Message (if you have MyChart) OR A paper copy in the mail If you have any lab test that is abnormal or we need to change your treatment, we will call you to review the results.   Testing/Procedures:    Follow-Up: At [facility], you and your health needs are our priority.  As part of our continuing mission to provide you with exceptional heart care, we have created designated Provider Care Teams.  These Care Teams include your primary Cardiologist (physician) and Advanced Practice Providers (APPs -  Physician Assistants and Nurse Practitioners) who all work together to provide you with the care you need, when you need it.  We recommend signing up for the patient portal called "MyChart".  Sign up information is provided on this After Visit Summary.  MyChart is used to connect with patients for Virtual Visits (Telemedicine).  Patients are able to view lab/test results, encounter notes, upcoming appointments, etc.  Non-urgent messages can be sent to your provider as well.   To learn more about what you can do with MyChart, go to [email or web address].    Your next appointment:   ONE  year(s)  Provider:   DR Dietrich Pates    Other Instructions

## 2023-06-25 LAB — NMR, LIPOPROFILE
Cholesterol, Total: 176 mg/dL (ref 100–199)
HDL Particle Number: 39.8 umol/L (ref >=30.5)
HDL-C: 63 mg/dL (ref >39)
LDL Particle Number: 1232 nmol/L — ABNORMAL HIGH (ref <1000)
LDL Size: 20.9 nmol (ref >20.5)
LDL-C (NIH Calc): 97 mg/dL (ref 0–99)
LP-IR Score: 53 — ABNORMAL HIGH (ref <=45)
Small LDL Particle Number: 535 nmol/L — ABNORMAL HIGH (ref <=527)
Triglycerides: 85 mg/dL (ref 0–149)

## 2023-06-25 LAB — APOLIPOPROTEIN B: Apolipoprotein B: 78 mg/dL (ref <90)

## 2023-06-25 LAB — LIPOPROTEIN A (LPA)

## 2023-06-30 ENCOUNTER — Other Ambulatory Visit: Payer: Self-pay

## 2023-06-30 DIAGNOSIS — E782 Mixed hyperlipidemia: Secondary | ICD-10-CM

## 2023-06-30 MED ORDER — ROSUVASTATIN CALCIUM 10 MG PO TABS: 10.0000 mg | ORAL_TABLET | Freq: Every day | ORAL | 3 refills | Status: DC

## 2023-08-05 ENCOUNTER — Other Ambulatory Visit: Payer: Self-pay

## 2023-08-05 ENCOUNTER — Telehealth: Payer: Self-pay | Admitting: Internal Medicine

## 2023-08-05 DIAGNOSIS — E782 Mixed hyperlipidemia: Secondary | ICD-10-CM

## 2023-08-05 NOTE — Telephone Encounter (Signed)
Patient here in clinic with his wife   She has an appt  Says since starting Crestor he hasa noticed some of his shots aren't as good   May have some achiness  REcomm  check Lipomed, Liver panel and CK today     Stop Crestor   Follow

## 2023-08-06 LAB — NMR, LIPOPROFILE
Cholesterol, Total: 93 mg/dL — ABNORMAL LOW (ref 100–199)
HDL Particle Number: 29.2 umol/L — ABNORMAL LOW (ref >=30.5)
HDL-C: 55 mg/dL (ref >39)
LDL Particle Number: 329 nmol/L (ref <1000)
LDL Size: 20 nmol — ABNORMAL LOW (ref >20.5)
LDL-C (NIH Calc): 25 mg/dL (ref 0–99)
LP-IR Score: 34 (ref <=45)
Small LDL Particle Number: 246 nmol/L (ref <=527)
Triglycerides: 56 mg/dL (ref 0–149)

## 2023-08-06 LAB — HEPATIC FUNCTION PANEL
ALT: 24 [IU]/L (ref 0–44)
AST: 32 [IU]/L (ref 0–40)
Albumin: 3.9 g/dL (ref 3.8–4.8)
Alkaline Phosphatase: 53 [IU]/L (ref 44–121)
Bilirubin Total: 0.4 mg/dL (ref 0.0–1.2)
Bilirubin, Direct: 0.18 mg/dL (ref 0.00–0.40)
Total Protein: 5.5 g/dL — ABNORMAL LOW (ref 6.0–8.5)

## 2023-08-06 LAB — CK: Total CK: 77 U/L (ref 41–331)

## 2023-08-11 ENCOUNTER — Other Ambulatory Visit: Payer: Self-pay

## 2023-09-18 ENCOUNTER — Other Ambulatory Visit (HOSPITAL_COMMUNITY): Payer: Self-pay | Admitting: Adult Health

## 2023-09-18 DIAGNOSIS — R310 Gross hematuria: Secondary | ICD-10-CM

## 2023-09-19 ENCOUNTER — Ambulatory Visit (HOSPITAL_COMMUNITY)
Admission: RE | Admit: 2023-09-19 | Discharge: 2023-09-19 | Disposition: A | Discharge status: Home / Self Care | Payer: Medicare Other | Source: Ambulatory Visit | Attending: Adult Health | Admitting: Adult Health

## 2023-09-19 DIAGNOSIS — R310 Gross hematuria: Secondary | ICD-10-CM | POA: Diagnosis present

## 2023-09-19 MED ORDER — SODIUM CHLORIDE (PF) 0.9 % IJ SOLN
INTRAMUSCULAR | Status: AC
Start: 2023-09-19 — End: ?
  Filled 2023-09-19: qty 200

## 2023-09-19 MED ORDER — IOHEXOL 300 MG/ML  SOLN
100.0000 mL | Freq: Once | INTRAMUSCULAR | Status: AC | PRN
  Administered 2023-09-19: 100 mL via INTRAVENOUS

## 2023-09-22 ENCOUNTER — Other Ambulatory Visit: Payer: Self-pay | Admitting: Urology

## 2023-09-22 NOTE — Patient Instructions (Addendum)
 SURGICAL WAITING ROOM VISITATION  Patients having surgery or a procedure may have no more than 2 support people in the waiting area - these visitors may rotate.    Children under the age of 41 must have an adult with them who is not the patient.  Due to an increase in RSV and influenza rates and associated hospitalizations, children ages 2 and under may not visit patients in [facility] hospitals.  If the patient needs to stay at the hospital during part of their recovery, the visitor guidelines for inpatient rooms apply. Pre-op nurse will coordinate an appropriate time for 1 support person to accompany patient in pre-op.  This support person may not rotate.    Please refer to the [facility] website for the visitor guidelines for Inpatients (after your surgery is over and you are in a regular room).    Your procedure is scheduled on: 09/26/23   Report to [facility] Main Entrance    Report to admitting at 5:15 AM   Call this number if you have problems the morning of surgery [phone]   Do not eat food or drink liquids :After Midnight.          If you have questions, please contact your surgeon's office.   FOLLOW BOWEL PREP AND ANY ADDITIONAL PRE OP INSTRUCTIONS YOU RECEIVED FROM YOUR SURGEON'S OFFICE!!!     Oral Hygiene is also important to reduce your risk of infection.                                    Remember - BRUSH YOUR TEETH THE MORNING OF SURGERY WITH YOUR REGULAR TOOTHPASTE  DENTURES WILL BE REMOVED PRIOR TO SURGERY PLEASE DO NOT APPLY Poly grip OR ADHESIVES!!!   Stop all vitamins and herbal supplements 7 days before surgery.   Take these medicines the morning of surgery with A SIP OF WATER : Tylenol , Zetia                              You may not have any metal on your body including jewelry, and body piercing             Do not wear lotions, powders, cologne, or deodorant              Men may shave face and neck.   Do not bring valuables to the  hospital. [facility] IS NOT             RESPONSIBLE   FOR VALUABLES.   Contacts, glasses, dentures or bridgework may not be worn into surgery.  DO NOT BRING YOUR HOME MEDICATIONS TO THE HOSPITAL. PHARMACY WILL DISPENSE MEDICATIONS LISTED ON YOUR MEDICATION LIST TO YOU DURING YOUR ADMISSION IN THE HOSPITAL!    Patients discharged on the day of surgery will not be allowed to drive home.  Someone NEEDS to stay with you for the first 24 hours after anesthesia.   Special Instructions: Bring a copy of your healthcare power of attorney and living will documents the day of surgery if you haven't scanned them before.              Please read over the following fact sheets you were given: IF YOU HAVE QUESTIONS ABOUT YOUR PRE-OP INSTRUCTIONS PLEASE CALL [phone] [place]    If you received a COVID test during your pre-op visit  it is requested that you wear a mask when out in public, stay away from anyone that may not be feeling well and notify your surgeon if you develop symptoms. If you test positive for Covid or have been in contact with anyone that has tested positive in the last 10 days please notify you surgeon.    [facility] - Preparing for Surgery Before surgery, you can play an important role.  Because skin is not sterile, your skin needs to be as free of germs as possible.  You can reduce the number of germs on your skin by washing with CHG (chlorahexidine gluconate) soap before surgery.  CHG is an antiseptic cleaner which kills germs and bonds with the skin to continue killing germs even after washing. Please DO NOT use if you have an allergy to CHG or antibacterial soaps.  If your skin becomes reddened/irritated stop using the CHG and inform your nurse when you arrive at Short Stay. Do not shave (including legs and underarms) for at least 48 hours prior to the first CHG shower.  You may shave your face/neck.  Please follow these instructions carefully:  1.  Shower with CHG Soap the night  before surgery and the  morning of surgery.  2.  If you choose to wash your hair, wash your hair first as usual with your normal  shampoo.  3.  After you shampoo, rinse your hair and body thoroughly to remove the shampoo.                             4.  Use CHG as you would any other liquid soap.  You can apply chg directly to the skin and wash.  Gently with a scrungie or clean washcloth.  5.  Apply the CHG Soap to your body ONLY FROM THE NECK DOWN.   Do   not use on face/ open                           Wound or open sores. Avoid contact with eyes, ears mouth and   genitals (private parts).                       Wash face,  Genitals (private parts) with your normal soap.             6.  Wash thoroughly, paying special attention to the area where your    surgery  will be performed.  7.  Thoroughly rinse your body with warm water  from the neck down.  8.  DO NOT shower/wash with your normal soap after using and rinsing off the CHG Soap.                9.  Pat yourself dry with a clean towel.            10.  Wear clean pajamas.            11.  Place clean sheets on your bed the night of your first shower and do not  sleep with pets. Day of Surgery : Do not apply any lotions/deodorants the morning of surgery.  Please wear clean clothes to the hospital/surgery center.  FAILURE TO FOLLOW THESE INSTRUCTIONS MAY RESULT IN THE CANCELLATION OF YOUR SURGERY  PATIENT SIGNATURE_________________________________  NURSE SIGNATURE__________________________________  ________________________________________________________________________

## 2023-09-22 NOTE — Progress Notes (Signed)
 Please place orders for PAT appointment scheduled 09/23/23.

## 2023-09-22 NOTE — Progress Notes (Addendum)
 COVID Vaccine Completed: yes  Date of COVID positive in last 90 days:  PCP - Elsie Gentry, MD Cardiologist - Vina Gull, MD LOV 06/23/23  Chest x-ray - n/a EKG - 06/23/23 Epic Stress Test - n/a ECHO - 12/01/20 Epic Cardiac Cath - n/a Pacemaker/ICD device last checked: n/a Spinal Cord Stimulator: n/a  Bowel Prep - no  Sleep Study - n/a CPAP -   Fasting Blood Sugar - n/a Checks Blood Sugar _____ times a day  Last dose of GLP1 agonist-  N/A GLP1 instructions:  Hold 7 days before surgery    Last dose of SGLT-2 inhibitors-  N/A SGLT-2 instructions:  Hold 3 days before surgery    Blood Thinner Instructions:  n/a Aspirin  Instructions: Last Dose:  Activity level: Can go up a flight of stairs and perform activities of daily living without stopping and without symptoms of chest pain or shortness of breath.  Anesthesia review: HTN, RBBB, 1st degree AV block, hepatits  Patient denies shortness of breath, fever, cough and chest pain at PAT appointment  Patient verbalized understanding of instructions that were given to them at the PAT appointment. Patient was also instructed that they will need to review over the PAT instructions again at home before surgery.

## 2023-09-23 ENCOUNTER — Encounter (HOSPITAL_COMMUNITY)
Admission: RE | Admit: 2023-09-23 | Discharge: 2023-09-23 | Disposition: A | Discharge status: Home / Self Care | Payer: Medicare Other | Source: Ambulatory Visit | Attending: Urology | Admitting: Urology

## 2023-09-23 ENCOUNTER — Other Ambulatory Visit: Payer: Self-pay

## 2023-09-23 ENCOUNTER — Encounter (HOSPITAL_COMMUNITY): Payer: Self-pay

## 2023-09-23 VITALS — BP 143/79 | HR 60 | Temp 98.0°F | Resp 14 | Ht 72.0 in | Wt 207.0 lb

## 2023-09-23 DIAGNOSIS — K219 Gastro-esophageal reflux disease without esophagitis: Secondary | ICD-10-CM | POA: Diagnosis not present

## 2023-09-23 DIAGNOSIS — I700 Atherosclerosis of aorta: Secondary | ICD-10-CM | POA: Diagnosis not present

## 2023-09-23 DIAGNOSIS — Z8546 Personal history of malignant neoplasm of prostate: Secondary | ICD-10-CM | POA: Diagnosis not present

## 2023-09-23 DIAGNOSIS — K5730 Diverticulosis of large intestine without perforation or abscess without bleeding: Secondary | ICD-10-CM | POA: Insufficient documentation

## 2023-09-23 DIAGNOSIS — I10 Essential (primary) hypertension: Secondary | ICD-10-CM

## 2023-09-23 DIAGNOSIS — M1711 Unilateral primary osteoarthritis, right knee: Secondary | ICD-10-CM | POA: Diagnosis not present

## 2023-09-23 DIAGNOSIS — N3041 Irradiation cystitis with hematuria: Secondary | ICD-10-CM | POA: Diagnosis not present

## 2023-09-23 DIAGNOSIS — Z981 Arthrodesis status: Secondary | ICD-10-CM | POA: Diagnosis not present

## 2023-09-23 DIAGNOSIS — I119 Hypertensive heart disease without heart failure: Secondary | ICD-10-CM | POA: Diagnosis not present

## 2023-09-23 DIAGNOSIS — I4510 Unspecified right bundle-branch block: Secondary | ICD-10-CM | POA: Diagnosis not present

## 2023-09-23 DIAGNOSIS — Z01812 Encounter for preprocedural laboratory examination: Secondary | ICD-10-CM | POA: Insufficient documentation

## 2023-09-23 DIAGNOSIS — K449 Diaphragmatic hernia without obstruction or gangrene: Secondary | ICD-10-CM | POA: Diagnosis not present

## 2023-09-23 LAB — CBC
HCT: 41.8 % (ref 39.0–52.0)
Hemoglobin: 13.1 g/dL (ref 13.0–17.0)
MCH: 30 pg (ref 26.0–34.0)
MCHC: 31.3 g/dL (ref 30.0–36.0)
MCV: 95.9 fL (ref 80.0–100.0)
Platelets: 210 10*3/uL (ref 150–400)
RBC: 4.36 MIL/uL (ref 4.22–5.81)
RDW: 13.2 % (ref 11.5–15.5)
WBC: 6 10*3/uL (ref 4.0–10.5)
nRBC: 0 % (ref 0.0–0.2)

## 2023-09-23 LAB — BASIC METABOLIC PANEL
Anion gap: 5 (ref 5–15)
BUN: 19 mg/dL (ref 8–23)
CO2: 23 mmol/L (ref 22–32)
Calcium: 8.9 mg/dL (ref 8.9–10.3)
Chloride: 104 mmol/L (ref 98–111)
Creatinine, Ser: 0.87 mg/dL (ref 0.61–1.24)
GFR, Estimated: >60 mL/min (ref >60)
Glucose, Bld: 120 mg/dL — ABNORMAL HIGH (ref 70–99)
Potassium: 4.5 mmol/L (ref 3.5–5.1)
Sodium: 132 mmol/L — ABNORMAL LOW (ref 135–145)

## 2023-09-24 ENCOUNTER — Other Ambulatory Visit (HOSPITAL_COMMUNITY): Payer: Medicare Other

## 2023-09-24 ENCOUNTER — Encounter (HOSPITAL_COMMUNITY): Payer: Self-pay

## 2023-09-24 NOTE — H&P (Signed)
 cc: Gross hematuria and inability to void   HPI: 78 year old man who underwent brachii seed therapy in 2014 presents today with concerns of gross hematuria that started last night. This is associated with frequency, urgency, and mild dysuria. Throughout the course of the evening he developed inability to void. He was urgently worked into my schedule this morning. He is not on any blood thinners. His PVR is 547 mL. He denies fevers, chills, history of kidney stones. He has never had this issue before.   Interval: Mr. Duke presents today with concerns that his catheter has not been draining.   09/22/23: Josiah Nigh returns today in f/u. The bladder has stopped with the recent irrigation. he is for cystosocpy today. His urine culture was negative.     ALLERGIES: Penicillin    MEDICATIONS: Myrbetriq 50 mg tablet, extended release 24 hr 1 tablet PO Daily  Tamsulosin  Hcl 0.4 mg capsule 1 capsule PO Daily  Advil TABS PRN  Atorvastatin  Calcium  20 mg tablet  Lisinopril  30 mg tablet Oral  Sildenafil Citrate 100 mg tablet 1-2 tablet PO Daily PRN  Tylenol  PRN  Uribel Tabs 81.6 mg-10.8 mg-9 mg-36.2 mg-0.12 mg tablet 1 tablet PO TID     GU PSH: TRANSPERI NEEDLE PLACE, PROS - 2014       PSH Notes: Surgery Prostate Transperineal Placement Of Needles, Ankle Surgery, neck and spinal fusion - Jan 2015   NON-GU PSH: Visit Complexity (formerly GPC1X) - 07/16/2023, 01/10/2023     GU PMH: Gross hematuria - 09/18/2023, - 09/16/2023 Urinary Retention (Stable) - 09/18/2023, - 09/16/2023 ED due to arterial insufficiency, He is doing well on sildenafil. - 07/16/2023, sildenafil dispensed., - 01/10/2023, He continues to respond to sildenafil, - 07/12/2022, sildenafil refilled. , - 08/27/2021, He continues to do well with sildenafil. , - 2022, Erectile dysfunction due to arterial insufficiency, - 2015 History of prostate cancer, His PSA continues to rise with a PSADT of 12-24 months. Repeat PSA in 6 months. I will restage if he gets  to 0.5. - 07/16/2023, His PSA has stabilized at 0.14. Repeat in 6 months. , - 01/10/2023, His PSA is rising with a shortening PSADT. I will try again to get the PSMA PET ordered. He will continue 3 month PSA f/u. , - 07/12/2022, He is doing well but the PSA continues to rise slowly. I will repeat in 6 months and restage if it gets above 0.2. , - 08/27/2021, He continues to do well but his PSA is rising with a PSADT of about 12-15 months., - 2022, History of prostate cancer, - 2016, History of malignant neoplasm of prostate, - 2016 Nocturia, He has persistent LUTS that is variable but is improved with Myrbetriq and tamsulsoin. - 07/16/2023, - 07/12/2022 Rising PSA after prostate cancer treatment - 07/16/2023, - 07/12/2022, - 08/27/2021, - 2022, - 2020 Urinary Urgency - 07/16/2023, He continues to have some urgency but doesn't require pads. , - 07/12/2022, He has some urgency. I have recommended he try to cut back on coffee in the morning to see if that will help. , - 08/27/2021, Urinary urgency, - 2016 Urge incontinence, The [place] is not well covered for him. I have given him samples of Myrbetriq 25 and 50mg  with instructions and a review of the side effects. I also gave him Gemtesa samples should the Myrbetriq not be as effective. - 01/10/2023, He has occasional UUI that is mild. hopefully reducing irritants will help. 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 I encouraged him to use absorbant material at the anus when walking. He was encouraged to see Dr. Elvin Hammer. 2018/02/22 Encounter for general adult medical examination without abnormal findings, Encounter for preventive health examination - 02/23/2015 Anxiety, Anxiety (Symptom) - 02/22/13 Personal history of other diseases of the  digestive system, History of esophageal reflux - 2013/02/22    FAMILY HISTORY: Death In The Family Mother - Runs In Family Family Health Status Number - Runs In Family Family Health Status Of Father - Alive - Runs In Family Prostate Cancer - Father   SOCIAL HISTORY: Marital Status: Married Preferred Language: English; Ethnicity: Not Hispanic Or Latino; Race: White Current Smoking Status: Patient has never smoked.  Does drink.  Drinks 1 caffeinated drink per day.     Notes: Never A Smoker, Occupation:, Caffeine Use, Alcohol Use   REVIEW OF SYSTEMS:    GU Review Male:   Patient denies frequent urination, hard to postpone urination, burning/ pain with urination, get up at night to urinate, leakage of urine, stream starts and stops, trouble starting your stream, have to strain to urinate , erection problems, and penile pain.  Gastrointestinal (Upper):   Patient denies nausea, vomiting, and indigestion/ heartburn.  Gastrointestinal (Lower):   Patient denies diarrhea and constipation.  Constitutional:   Patient denies fever, night sweats, weight loss, and fatigue.  Skin:   Patient denies skin rash/ lesion and itching.  Eyes:   Patient denies blurred vision and double vision.  Ears/ Nose/ Throat:   Patient denies sinus problems and sore throat.  Hematologic/Lymphatic:   Patient denies swollen glands and easy bruising.  Cardiovascular:   Patient denies leg swelling and chest pains.  Respiratory:   Patient denies cough and shortness of breath.  Endocrine:   Patient denies excessive thirst.  Musculoskeletal:   Patient denies back pain and joint pain.  Neurological:   Patient denies headaches and dizziness.  Psychologic:   Patient denies depression and anxiety.   VITAL SIGNS: None   MULTI-SYSTEM PHYSICAL EXAMINATION:    Constitutional: Well-nourished. No physical deformities. Normally developed. Good grooming.  Respiratory: Normal breath sounds. No labored breathing, no use of accessory muscles.    Cardiovascular: Regular rate and rhythm. No murmur, no gallop.      Complexity of Data:  Records Review:   Previous Patient Records  X-Ray Review: C.T. Hematuria: Reviewed Films. Reviewed Report. Discussed With Patient. There was bladder wall thickening wtih some pervesical stranding but no other GU findings apart from the seeds.     07/09/23 01/08/23 10/30/22 07/08/22 04/29/22 08/20/21 02/21/21 04/12/20  PSA  Total PSA 0.22 ng/mL 0.14 ng/mL 0.14 ng/mL 0.16 ng/mL 0.134 ng/ml 0.109 ng/mL 0.082 ng/mL 0.046 ng/mL    PROCEDURES:         Flexible Cystoscopy - 52000  Risks, benefits, and some of the potential complications of the procedure were discussed. He was prepped with betadine  and the urethral was instilled with 2% lidocaine  jelly. Cipro  500mg  given for antibiotic prophylaxis.     Meatus:  Normal size. Normal location. Normal condition.  Urethra:  No strictures.  External Sphincter:  Normal.  Verumontanum:  Normal.  Prostate:  Non-obstructing. No hyperplasia. radiation blanching with a small oozing vessel, after scope passage in the mid prostatic urethra at 6 o'clock. filmy adhesion proximally that was easily disrupted.  Bladder Neck:  Non-obstructing.  Ureteral Orifices:  not clearly seen with collapsed bladder.   Bladder:  The bladder was collapsed and wouldn't easily expand. There was patchy erythema with edema particularly on the posterior wall and left trigone/lateral wall without papillary tumors. More consistent with radiation or inflammation. No active bleeding in the bladder.       The procedure was well tolerated and there were no complications.   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 I am going to set him up to go to the OR later this week for a  more thorough cystoscopy with possible biopsy and fulguration. I reviewed the risks in detail.   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 Jeana Michaels.         Next Appointment:      Next Appointment: 09/26/2023 07:30 AM    Appointment Type: Surgery     Location: [facility] [phone]    Provider: Homero Luster, M.D.    Reason for Visit: OP--[facility]--CYSTO, POSS BX \\T \ FULGURATION

## 2023-09-24 NOTE — Progress Notes (Addendum)
 Case: [id number] Date/Time: 09/26/23 0715   Procedure: CYSTOSCOPY WITH POSSIBLE BIOPSY AND FULGURATION - 60 MINUTE CASE   Anesthesia type: General   Pre-op diagnosis: HEMATURIA WITH PRIOR RADIATION   Location: [facility] PROCEDURE [facility] / [facility]   Surgeons: Watt Rush, MD       DISCUSSION: Eric Dominguez is a 78 yo male who presents to PAT prior to surgery above. PMH of HTN, trifasicular block, GERD, hx of hepatitis (not specified), hx of prostate cancer s/p brachy seed therapy in 2014, s/p ACDF C3-C6 (2017).  Patient developed gross hematuria with urinary retention and is cystoscopy in Urology clinic showed radiation cystitis with bleeding however due to other suspicious findings he is scheduled for more thorough procedure above.  He follows with Cardiology for hx of syncope and bifasicular block. He had an echo done and cardiac monitoring which came back reassuring. Last seen in clinic on 06/23/23 by Dr. Okey. Noted to be doing well and without symptoms. Advised continue medical management. Could not tolerate statin. Advised f/u in 1 year.  VS: BP (!) 143/79   Pulse 60   Temp 36.7 C (Oral)   Resp 14   Ht 6' (1.829 m)   Wt 93.9 kg   SpO2 97%   BMI 28.07 kg/m   PROVIDERS: Loreli Elsie JONETTA Mickey., MD Cardiology: Vina Okey, MD  LABS: Labs reviewed: Acceptable for surgery. (all labs ordered are listed, but only abnormal results are displayed)  Labs Reviewed  BASIC METABOLIC PANEL - Abnormal; Notable for the following components:      Result Value   Sodium 132 (*)    Glucose, Bld 120 (*)    All other components within normal limits  CBC     IMAGES:  CT hematuria 09/19/23:  IMPRESSION: 1. Urinary bladder wall thickening and perivesicular fat stranding. Recommend correlation with urinalysis for cystitis. Limited evaluation due to under distension in the setting of a Foley catheter. 2. Colonic diverticulosis with no acute diverticulitis. 3. Question developing avascular necrosis of  the right femoral head. Recommend attention on follow-up. 4. Tiny hiatal hernia. 5.  Aortic Atherosclerosis (ICD10-I70.0).  EKG 06/23/23  Sinus bradycardia with 1st degree A-V block, rate 59 Right bundle branch block Left anterior fascicular block Bifascicular block Possible septal MI Possible lateral MI  CV:  CT calcium  score 06/04/2021:  IMPRESSION: Coronary calcium  score of 107.6 is at the 35th percentile for the patient's age, sex and race.  Cardiac monitor 12/14/2020:  Patient's monitoring period was from 11/13/20-12/12/20. Predominant rhythm was NSR with average HR 61bpm; ranging from 42-159bpm Rare PVCs, rare SVE No significant arrhythmias Overall, normal cardiac monitor with no significant arrhythmias or pauses detected.  Echo 12/01/2020:  IMPRESSIONS    1. Left ventricular ejection fraction, by estimation, is 55 to 60%. The left ventricle has normal function. The left ventricle has no regional wall motion abnormalities. There is moderate left ventricular hypertrophy. Left ventricular diastolic parameters are consistent with Grade I diastolic dysfunction (impaired relaxation).  2. Right ventricular systolic function is normal. The right ventricular size is normal.  3. The mitral valve is normal in structure. Trivial mitral valve regurgitation. No evidence of mitral stenosis.  4. The aortic valve is tricuspid. Aortic valve regurgitation is not visualized. Mild aortic valve sclerosis is present, with no evidence of aortic valve stenosis.  5. The inferior vena cava is normal in size with greater than 50% respiratory variability, suggesting right atrial pressure of 3 mmHg. Past Medical History:  Diagnosis Date  First degree heart block    GERD (gastroesophageal reflux disease)    OCCASIONAL ZANTAC AT HS   Hepatitis 1975   Hypertension    Primary localized osteoarthrosis of the knee, right 07/29/2017   Prostate cancer (HCC) 11/26/12   gleason3+4=7,&  3+3=6,PSA=4.65 volume=42.5cc   RBBB     Past Surgical History:  Procedure Laterality Date   CERVICAL DISC SURGERY     COLONOSCOPY  2016   CYSTOSCOPY N/A 01/14/2013   Procedure: CYSTOSCOPY FLEXIBLE;  Surgeon: Norleen JINNY Seltzer, MD;  Location: [facility];  Service: Urology;  Laterality: N/A;  no seeds found in bladder   HARDWARE REMOVAL Right 08/11/2017   Procedure: HARDWARE REMOVAL RIGHT KNEE;  Surgeon: Jane Charleston, MD;  Location: [facility] OR;  Service: Orthopedics;  Laterality: Right;   LEFT ANKLE SURGERY  2012   RADIOACTIVE SEED IMPLANT N/A 01/14/2013   Procedure: RADIOACTIVE SEED IMPLANT;  Surgeon: Norleen JINNY Seltzer, MD;  Location: [facility];  Service: Urology;  Laterality: N/A;  68    seeds implanted   REPAIR TORN BICEP  Right 2012   TOTAL KNEE ARTHROPLASTY Right 08/11/2017   Procedure: RIGHT TOTAL KNEE ARTHROPLASTY;  Surgeon: Jane Charleston, MD;  Location: [facility] OR;  Service: Orthopedics;  Laterality: Right;    MEDICATIONS:  acetaminophen  (TYLENOL ) 500 MG tablet   diclofenac Sodium (VOLTAREN) 1 % GEL   ezetimibe (ZETIA) 10 MG tablet   lisinopril  (ZESTRIL ) 20 MG tablet   Menthol , Topical Analgesic, (BIOFREEZE EX)   rosuvastatin  (CRESTOR ) 10 MG tablet   No current facility-administered medications for this encounter.   Burnard CHRISTELLA Odis DEVONNA [facility] Surgical Short Stay/Anesthesiology [facility] Phone [phone] 09/24/2023 10:21 AM

## 2023-09-24 NOTE — Anesthesia Preprocedure Evaluation (Addendum)
Anesthesia Evaluation  Patient identified by MRN, date of birth, ID band Patient awake    Reviewed: Allergy & Precautions, NPO status , Patient's Chart, lab work & pertinent test results, reviewed documented beta blocker date and time   History of Anesthesia Complications Negative for: history of anesthetic complications  Airway Mallampati: III  TM Distance: >3 FB Neck ROM: Full    Dental no notable dental hx.    Pulmonary neg COPD, neg PE   breath sounds clear to auscultation       Cardiovascular hypertension, (-) angina (-) CAD, (-) Past MI and (-) Cardiac Stents + dysrhythmias (-) pacemaker(-) Cardiac Defibrillator (-) Valvular Problems/Murmurs Rhythm:Regular Rate:Normal     Neuro/Psych neg Seizures    GI/Hepatic ,GERD  Controlled and Medicated,,  Endo/Other    Renal/GU Renal disease     Musculoskeletal  (+) Arthritis ,    Abdominal   Peds  Hematology   Anesthesia Other Findings   Reproductive/Obstetrics                              Anesthesia Physical Anesthesia Plan  ASA: 2  Anesthesia Plan: General   Post-op Pain Management:    Induction: Intravenous  PONV Risk Score and Plan: 2 and Ondansetron  Airway Management Planned: LMA  Additional Equipment:   Intra-op Plan:   Post-operative Plan: Extubation in OR  Informed Consent: I have reviewed the patients History and Physical, chart, labs and discussed the procedure including the risks, benefits and alternatives for the proposed anesthesia with the patient or authorized representative who has indicated his/her understanding and acceptance.     Dental advisory given  Plan Discussed with: CRNA  Anesthesia Plan Comments: (See PAT note from 1/14 by Sherlie Ban PA-C )         Anesthesia Quick Evaluation

## 2023-09-26 ENCOUNTER — Other Ambulatory Visit: Payer: Self-pay

## 2023-09-26 ENCOUNTER — Encounter (HOSPITAL_COMMUNITY): Payer: Self-pay | Admitting: Urology

## 2023-09-26 ENCOUNTER — Ambulatory Visit (HOSPITAL_BASED_OUTPATIENT_CLINIC_OR_DEPARTMENT_OTHER): Payer: Medicare Other | Admitting: Anesthesiology

## 2023-09-26 ENCOUNTER — Ambulatory Visit (HOSPITAL_COMMUNITY)
Admission: RE | Admit: 2023-09-26 | Discharge: 2023-09-26 | Disposition: A | Discharge status: Home / Self Care | Payer: Medicare Other | Source: Ambulatory Visit | Attending: Urology | Admitting: Urology

## 2023-09-26 ENCOUNTER — Ambulatory Visit (HOSPITAL_COMMUNITY): Payer: Medicare Other | Admitting: Medical

## 2023-09-26 ENCOUNTER — Encounter (HOSPITAL_COMMUNITY)
Admission: RE | Disposition: A | Discharge status: Home / Self Care | Payer: Self-pay | Source: Ambulatory Visit | Attending: Urology

## 2023-09-26 DIAGNOSIS — I10 Essential (primary) hypertension: Secondary | ICD-10-CM | POA: Diagnosis not present

## 2023-09-26 DIAGNOSIS — N3041 Irradiation cystitis with hematuria: Secondary | ICD-10-CM | POA: Insufficient documentation

## 2023-09-26 DIAGNOSIS — K219 Gastro-esophageal reflux disease without esophagitis: Secondary | ICD-10-CM | POA: Diagnosis not present

## 2023-09-26 DIAGNOSIS — C675 Malignant neoplasm of bladder neck: Secondary | ICD-10-CM | POA: Insufficient documentation

## 2023-09-26 DIAGNOSIS — Z8546 Personal history of malignant neoplasm of prostate: Secondary | ICD-10-CM | POA: Insufficient documentation

## 2023-09-26 DIAGNOSIS — E785 Hyperlipidemia, unspecified: Secondary | ICD-10-CM | POA: Diagnosis not present

## 2023-09-26 DIAGNOSIS — R310 Gross hematuria: Secondary | ICD-10-CM | POA: Diagnosis present

## 2023-09-26 HISTORY — PX: CYSTOSCOPY WITH BIOPSY: SHX5122

## 2023-09-26 SURGERY — CYSTOSCOPY, WITH BIOPSY
Anesthesia: General

## 2023-09-26 MED ORDER — FENTANYL CITRATE (PF) 100 MCG/2ML IJ SOLN
INTRAMUSCULAR | Status: DC | PRN
  Administered 2023-09-26: 50 ug via INTRAVENOUS

## 2023-09-26 MED ORDER — STERILE WATER FOR IRRIGATION IR SOLN
Status: DC | PRN
  Administered 2023-09-26: 3000 mL

## 2023-09-26 MED ORDER — ORAL CARE MOUTH RINSE: 15.0000 mL | Freq: Once | OROMUCOSAL | Status: AC

## 2023-09-26 MED ORDER — LIDOCAINE HCL (PF) 2 % IJ SOLN
INTRAMUSCULAR | Status: AC
  Filled 2023-09-26: qty 5

## 2023-09-26 MED ORDER — ONDANSETRON HCL 4 MG/2ML IJ SOLN
INTRAMUSCULAR | Status: DC | PRN
  Administered 2023-09-26: 4 mg via INTRAVENOUS

## 2023-09-26 MED ORDER — LIDOCAINE HCL (CARDIAC) PF 100 MG/5ML IV SOSY
PREFILLED_SYRINGE | INTRAVENOUS | Status: DC | PRN
  Administered 2023-09-26: 100 mg via INTRAVENOUS

## 2023-09-26 MED ORDER — LACTATED RINGERS IV SOLN: INTRAVENOUS | Status: DC

## 2023-09-26 MED ORDER — SODIUM CHLORIDE 0.9% FLUSH: 3.0000 mL | Freq: Two times a day (BID) | INTRAVENOUS | Status: DC

## 2023-09-26 MED ORDER — FENTANYL CITRATE (PF) 100 MCG/2ML IJ SOLN
INTRAMUSCULAR | Status: AC
  Filled 2023-09-26: qty 2

## 2023-09-26 MED ORDER — DEXAMETHASONE SODIUM PHOSPHATE 4 MG/ML IJ SOLN
INTRAMUSCULAR | Status: DC | PRN
  Administered 2023-09-26: 10 mg via INTRAVENOUS

## 2023-09-26 MED ORDER — CHLORHEXIDINE GLUCONATE 0.12 % MT SOLN
15.0000 mL | Freq: Once | OROMUCOSAL | Status: AC
  Administered 2023-09-26: 15 mL via OROMUCOSAL

## 2023-09-26 MED ORDER — PROPOFOL 10 MG/ML IV BOLUS
INTRAVENOUS | Status: DC | PRN
  Administered 2023-09-26: 130 mg via INTRAVENOUS

## 2023-09-26 MED ORDER — CIPROFLOXACIN IN D5W 400 MG/200ML IV SOLN
400.0000 mg | INTRAVENOUS | Status: AC
  Administered 2023-09-26: 400 mg via INTRAVENOUS
  Filled 2023-09-26: qty 200

## 2023-09-26 MED ORDER — HYDROCODONE-ACETAMINOPHEN 5-325 MG PO TABS: 1.0000 | ORAL_TABLET | Freq: Four times a day (QID) | ORAL | 0 refills | Status: DC | PRN

## 2023-09-26 MED ORDER — PHENYLEPHRINE HCL-NACL 20-0.9 MG/250ML-% IV SOLN
INTRAVENOUS | Status: AC
  Filled 2023-09-26: qty 250

## 2023-09-26 MED ORDER — MIDAZOLAM HCL 5 MG/5ML IJ SOLN
INTRAMUSCULAR | Status: DC | PRN
  Administered 2023-09-26 (×2): 1 mg via INTRAVENOUS

## 2023-09-26 MED ORDER — PROPOFOL 10 MG/ML IV BOLUS
INTRAVENOUS | Status: AC
  Filled 2023-09-26: qty 20

## 2023-09-26 MED ORDER — ONDANSETRON HCL 4 MG/2ML IJ SOLN
INTRAMUSCULAR | Status: AC
  Filled 2023-09-26: qty 2

## 2023-09-26 MED ORDER — SODIUM CHLORIDE 0.9 % IR SOLN
Status: DC | PRN
  Administered 2023-09-26: 4000 mL via INTRAVESICAL

## 2023-09-26 MED ORDER — DEXAMETHASONE SODIUM PHOSPHATE 10 MG/ML IJ SOLN
INTRAMUSCULAR | Status: AC
  Filled 2023-09-26: qty 1

## 2023-09-26 MED ORDER — MIDAZOLAM HCL 2 MG/2ML IJ SOLN
INTRAMUSCULAR | Status: AC
  Filled 2023-09-26: qty 2

## 2023-09-26 SURGICAL SUPPLY — 19 items
BAG URINE DRAIN 2000ML AR STRL (UROLOGICAL SUPPLIES) IMPLANT
BAG URO CATCHER STRL LF (MISCELLANEOUS) ×2 IMPLANT
CATH FOLEY 3WAY 30CC 22FR (CATHETERS) IMPLANT
CATH URETL OPEN 5X70 (CATHETERS) IMPLANT
DRAPE FOOT SWITCH (DRAPES) ×2 IMPLANT
ELECT REM PT RETURN 15FT ADLT (MISCELLANEOUS) ×2 IMPLANT
GLOVE SURG SS PI 8.0 STRL IVOR (GLOVE) ×2 IMPLANT
GOWN STRL REUS W/ TWL XL LVL3 (GOWN DISPOSABLE) ×2 IMPLANT
HOLDER FOLEY CATH W/STRAP (MISCELLANEOUS) IMPLANT
KIT TURNOVER KIT A (KITS) IMPLANT
LOOP CUT BIPOLAR 24F LRG (ELECTROSURGICAL) IMPLANT
MANIFOLD NEPTUNE II (INSTRUMENTS) ×2 IMPLANT
PACK CYSTO (CUSTOM PROCEDURE TRAY) ×2 IMPLANT
SUT ETHILON 3 0 PS 1 (SUTURE) IMPLANT
SYR 30ML LL (SYRINGE) IMPLANT
SYR TOOMEY IRRIG 70ML (MISCELLANEOUS)
SYRINGE TOOMEY IRRIG 70ML (MISCELLANEOUS) IMPLANT
TUBING CONNECTING 10 (TUBING) ×2 IMPLANT
TUBING UROLOGY SET (TUBING) ×2 IMPLANT

## 2023-09-26 NOTE — Op Note (Signed)
Procedure: 1.  Cystoscopy with transurethral resection of 1.5 cm left bladder neck tumor. 2.  Cystoscopy with fulguration of bladder neck and prostatic urethra.  Preop diagnosis: Gross hematuria with bladder wall erythema.  Postop diagnosis: 1.  Radiation cystitis. 2.  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  Surgeon: Dr. Bjorn Pippin.  Anesthesia: General.  Specimen: Bladder tumor chips.  Drains: None.  EBL: None.  Complications: None.  Indications: The patient is a 78 year old male who is approximately 10 years out from a radioactive seed implant for prostate cancer.  He does have a slowly rising PSA with a negative recent PET scan.  He recently presented with gross hematuria and clot retention and on subsequent cystoscopy was noted to have patchy erythema of the bladder wall but it was more prominent on the left and was not fully evaluated at office cystoscopy as it was felt that cystoscopy under anesthesia with possible biopsy and fulguration were indicated.  Procedure: He was taken to the operating room where a general anesthetic was induced.  He is placed in lithotomy position and fitted with PAS hose.  He was given Ancef.  His perineum and genitalia were prepped Betadine solution was draped in usual sterile fashion.  Cystoscopy was performed using the 21 [place] scope and 30 degree lens.  Examination really normal urethra.  The external sphincter was intact.  The prostatic urethra was widely patent but there was neovascularity consistent with radiation effect with some oozing at the bladder neck.  Examination the bladder demonstrated mild trabeculation with patchy erythema on the posterior wall and left lateral wall that was most consistent with a resolving inflammatory process but at the left bladder neck there was a small 1.5 cm nodule with some papillary mucosa overlying it which was concerning for neoplasm.  The trigone was somewhat inflamed in appearance but no additional papillary  changes were noted.  Ureteral orifices were unremarkable.  The urethra was then calibrated to 32 [place] with [employer] and a 26 French continuous-flow resectoscope sheath was inserted without difficulty using the visual obturator.  Visual obturator was replaced with an Wandra Scot handle with a bipolar loop and the 30 degree lens.  Saline was used the irrigant.  Initially some oozing at the bladder neck and prostatic urethra was fulgurated aid visualization.  I then inspected the area of concern at the left bladder neck and did indeed feel there was a nodular lesion with overlying papillary changes.  This lesion was then gently resected with care taken to avoid the obturator reflex and then the resection bed was fulgurated after the specimen was retrieved.  Once adequate hemostasis was achieved and inspection demonstrated no evidence of bladder perforation although there were muscular fibers visible I performed additional fulguration of the prostatic urethra because of further oozing.  I did not try to fulgurate all of the erythematous mucosa in the bladder as it was difficult to know what was from the prior catheterization or radiation changes and also do not feel that gemcitabine is indicated as this could be a prostate cancer recurrence or a urothelial neoplasm and the gemcitabine is not indicated for the prostate cancer recurrence additionally if he does have a urothelial neoplasm there is concern for muscle invasive and also asked tensive potential carcinoma in situ which would require additional treatment as well.  Since resection bed was small and he had no active bleeding or evidence of perforation I did not feel a Foley catheter was indicated.  He was taken  down from lithotomy position, his anesthetic was reversed and he was moved recovery in stable condition.  There were no complications.

## 2023-09-26 NOTE — Transfer of Care (Signed)
Immediate Anesthesia Transfer of Care Note  Patient: Eric Dominguez  Procedure(s) Performed: CYSTOSCOPY WITH BIOPSY  Patient Location: PACU  Anesthesia Type:General  Level of Consciousness: awake, alert , oriented, and patient cooperative  Airway & Oxygen Therapy: Patient Spontanous Breathing and Patient connected to face mask oxygen  Post-op Assessment: Report given to RN and Post -op Vital signs reviewed and stable  Post vital signs: Reviewed and stable  Last Vitals:  Vitals Value Taken Time  BP 135/72 09/26/23 0832  Temp 36.4 C 09/26/23 0832  Pulse 64 09/26/23 0834  Resp 14 09/26/23 0834  SpO2 98 % 09/26/23 0834  Vitals shown include unfiled device data.  Last Pain:  Vitals:   09/26/23 0555  TempSrc: Oral         Complications: No notable events documented.

## 2023-09-26 NOTE — Interval H&P Note (Signed)
History and Physical Interval Note:  He has had no further bleeding.   09/26/2023 7:19 AM  Eric Dominguez  has presented today for surgery, with the diagnosis of HEMATURIA WITH PRIOR RADIATION.  The various methods of treatment have been discussed with the patient and family. After consideration of risks, benefits and other options for treatment, the patient has consented to  Procedure(s) with comments: CYSTOSCOPY WITH POSSIBLE BIOPSY AND FULGURATION (N/A) - 60 MINUTE CASE as a surgical intervention.  The patient's history has been reviewed, patient examined, no change in status, stable for surgery.  I have reviewed the patient's chart and labs.  Questions were answered to the patient's satisfaction.     Bjorn Pippin

## 2023-09-26 NOTE — Anesthesia Postprocedure Evaluation (Signed)
Anesthesia Post Note  Patient: Toluwani Jewitt  Procedure(s) Performed: CYSTOSCOPY WITH BIOPSY     Patient location during evaluation: PACU Anesthesia Type: General Level of consciousness: awake and alert Pain management: pain level controlled Vital Signs Assessment: post-procedure vital signs reviewed and stable Respiratory status: spontaneous breathing, nonlabored ventilation, respiratory function stable and patient connected to nasal cannula oxygen Cardiovascular status: blood pressure returned to baseline and stable Postop Assessment: no apparent nausea or vomiting Anesthetic complications: no   No notable events documented.  Last Vitals:  Vitals:   09/26/23 0900 09/26/23 0915  BP: (!) 137/90 (!) 150/74  Pulse: (!) 52 (!) 54  Resp: 14 12  Temp:  36.5 C  SpO2: 95% 95%    Last Pain:  Vitals:   09/26/23 0915  TempSrc:   PainSc: 0-No pain                 Mariann Barter

## 2023-09-26 NOTE — Anesthesia Procedure Notes (Signed)
Procedure Name: LMA Insertion Date/Time: 09/26/2023 7:49 AM  Performed by: Dennison Nancy, CRNAPre-anesthesia Checklist: Patient identified, Emergency Drugs available, Suction available, Patient being monitored and Timeout performed Patient Re-evaluated:Patient Re-evaluated prior to induction Oxygen Delivery Method: Circle system utilized Preoxygenation: Pre-oxygenation with 100% oxygen Induction Type: IV induction Ventilation: Mask ventilation without difficulty LMA: LMA with gastric port inserted and LMA inserted LMA Size: 4.0 Dental Injury: Teeth and Oropharynx as per pre-operative assessment  Comments: !st attempt with #5 LMA Ambu AuraGain , did not seat well in posterior larynx; 2nd attempt with #4 LMA Ambu AuraGain LMA, ventilating thru LMA

## 2023-09-27 ENCOUNTER — Encounter (HOSPITAL_COMMUNITY): Payer: Self-pay | Admitting: Urology

## 2023-09-29 LAB — SURGICAL PATHOLOGY

## 2023-09-30 ENCOUNTER — Other Ambulatory Visit: Payer: Self-pay | Admitting: Urology

## 2023-10-20 NOTE — Patient Instructions (Addendum)
SURGICAL WAITING ROOM VISITATION  Patients having surgery or a procedure may have no more than 2 support people in the waiting area - these visitors may rotate.    Children under the age of 4 must have an adult with them who is not the patient.  Due to an increase in RSV and influenza rates and associated hospitalizations, children ages 69 and under may not visit patients in [facility] hospitals.  Visitors with respiratory illnesses are discouraged from visiting and should remain at home.  If the patient needs to stay at the hospital during part of their recovery, the visitor guidelines for inpatient rooms apply. Pre-op nurse will coordinate an appropriate time for 1 support person to accompany patient in pre-op.  This support person may not rotate.    Please refer to the [facility] website for the visitor guidelines for Inpatients (after your surgery is over and you are in a regular room).       Your procedure is scheduled on:  10/31/2023    Report to [facility] Main Entrance    Report to admitting at  1000 AM   Call this number if you have problems the morning of surgery [phone]   Do not eat food  or drink liquids :After Midnight.            If you have questions, please contact your surgeon's office.      Oral Hygiene is also important to reduce your risk of infection.                                    Remember - BRUSH YOUR TEETH THE MORNING OF SURGERY WITH YOUR REGULAR TOOTHPASTE  DENTURES WILL BE REMOVED PRIOR TO SURGERY PLEASE DO NOT APPLY "Poly grip" OR ADHESIVES!!!   Do NOT smoke after Midnight   Stop all vitamins and herbal supplements 7 days before surgery.   Take these medicines the morning of surgery with A SIP OF WATER:   none   DO NOT TAKE ANY ORAL DIABETIC MEDICATIONS DAY OF YOUR SURGERY  Bring CPAP mask and tubing day of surgery.                              You may not have any metal on your body including  jewelry, and body  piercing             Do not wear , lotions, powders, perfumes/cologne, or deodorant              Men may shave face and neck.   Do not bring valuables to the hospital. [facility] IS NOT             RESPONSIBLE   FOR VALUABLES.   Contacts, glasses, dentures or bridgework may not be worn into surgery.   Bring small overnight bag day of surgery.   DO NOT BRING YOUR HOME MEDICATIONS TO THE HOSPITAL. PHARMACY WILL DISPENSE MEDICATIONS LISTED ON YOUR MEDICATION LIST TO YOU DURING YOUR ADMISSION IN THE HOSPITAL!    Patients discharged on the day of surgery will not be allowed to drive home.  Someone NEEDS to stay with you for the first 24 hours after anesthesia.   Special Instructions: Bring a copy of your healthcare power of attorney and living will documents the day of surgery if you haven't scanned them before.  Please read over the following fact sheets you were given: IF YOU HAVE QUESTIONS ABOUT YOUR PRE-OP INSTRUCTIONS PLEASE CALL [phone]    If you test positive for Covid or have been in contact with anyone that has tested positive in the last 10 days please notify you surgeon.    [place] [facility] - Preparing for Surgery Before surgery, you can play an important role.  Because skin is not sterile, your skin needs to be as free of germs as possible.  You can reduce the number of germs on your skin by washing with CHG (chlorahexidine gluconate) soap before surgery.  CHG is an antiseptic cleaner which kills germs and bonds with the skin to continue killing germs even after washing. Please DO NOT use if you have an allergy to CHG or antibacterial soaps.  If your skin becomes reddened/irritated stop using the CHG and inform your nurse when you arrive at Short Stay. Do not shave (including legs and underarms) for at least 48 hours prior to the first CHG shower.  You may shave your face/neck. Please follow these instructions carefully:  1.  Shower with CHG Soap the night before  surgery and the  morning of Surgery.  2.  If you choose to wash your hair, wash your hair first as usual with your  normal  shampoo.  3.  After you shampoo, rinse your hair and body thoroughly to remove the  shampoo.                           4.  Use CHG as you would any other liquid soap.  You can apply chg directly  to the skin and wash                       Gently with a scrungie or clean washcloth.  5.  Apply the CHG Soap to your body ONLY FROM THE NECK DOWN.   Do not use on face/ open                           Wound or open sores. Avoid contact with eyes, ears mouth and genitals (private parts).                       Wash face,  Genitals (private parts) with your normal soap.             6.  Wash thoroughly, paying special attention to the area where your surgery  will be performed.  7.  Thoroughly rinse your body with warm water from the neck down.  8.  DO NOT shower/wash with your normal soap after using and rinsing off  the CHG Soap.                9.  Pat yourself dry with a clean towel.            10.  Wear clean pajamas.            11.  Place clean sheets on your bed the night of your first shower and do not  sleep with pets. Day of Surgery : Do not apply any lotions/deodorants the morning of surgery.  Please wear clean clothes to the hospital/surgery center.  FAILURE TO FOLLOW THESE INSTRUCTIONS MAY RESULT IN THE CANCELLATION OF YOUR SURGERY PATIENT SIGNATURE_________________________________  NURSE SIGNATURE__________________________________  ________________________________________________________________________

## 2023-10-21 ENCOUNTER — Other Ambulatory Visit: Payer: Self-pay

## 2023-10-21 ENCOUNTER — Encounter (HOSPITAL_COMMUNITY): Payer: Self-pay

## 2023-10-21 ENCOUNTER — Encounter (HOSPITAL_COMMUNITY)
Admission: RE | Admit: 2023-10-21 | Discharge: 2023-10-21 | Disposition: A | Discharge status: Home / Self Care | Payer: Medicare Other | Source: Ambulatory Visit | Attending: Urology | Admitting: Urology

## 2023-10-21 VITALS — BP 134/69 | HR 55 | Temp 97.9°F | Resp 16 | Ht 72.0 in | Wt 205.0 lb

## 2023-10-21 DIAGNOSIS — Z01818 Encounter for other preprocedural examination: Secondary | ICD-10-CM

## 2023-10-21 DIAGNOSIS — Z01812 Encounter for preprocedural laboratory examination: Secondary | ICD-10-CM | POA: Diagnosis not present

## 2023-10-21 LAB — CBC
HCT: 39.3 % (ref 39.0–52.0)
Hemoglobin: 12.7 g/dL — ABNORMAL LOW (ref 13.0–17.0)
MCH: 30.5 pg (ref 26.0–34.0)
MCHC: 32.3 g/dL (ref 30.0–36.0)
MCV: 94.5 fL (ref 80.0–100.0)
Platelets: 180 10*3/uL (ref 150–400)
RBC: 4.16 MIL/uL — ABNORMAL LOW (ref 4.22–5.81)
RDW: 13.4 % (ref 11.5–15.5)
WBC: 7.5 10*3/uL (ref 4.0–10.5)
nRBC: 0 % (ref 0.0–0.2)

## 2023-10-21 LAB — COMPREHENSIVE METABOLIC PANEL
ALT: 17 U/L (ref 0–44)
AST: 20 U/L (ref 15–41)
Albumin: 4.3 g/dL (ref 3.5–5.0)
Alkaline Phosphatase: 53 U/L (ref 38–126)
Anion gap: 8 (ref 5–15)
BUN: 27 mg/dL — ABNORMAL HIGH (ref 8–23)
CO2: 26 mmol/L (ref 22–32)
Calcium: 9 mg/dL (ref 8.9–10.3)
Chloride: 104 mmol/L (ref 98–111)
Creatinine, Ser: 0.93 mg/dL (ref 0.61–1.24)
GFR, Estimated: >60 mL/min (ref >60)
Glucose, Bld: 108 mg/dL — ABNORMAL HIGH (ref 70–99)
Potassium: 4.2 mmol/L (ref 3.5–5.1)
Sodium: 138 mmol/L (ref 135–145)
Total Bilirubin: 0.9 mg/dL (ref 0.0–1.2)
Total Protein: 6.7 g/dL (ref 6.5–8.1)

## 2023-10-21 NOTE — Progress Notes (Addendum)
Anesthesia Review: HTN,   KGM:[id number] Eric Dominguez ,MD Cardiologist : Dietrich Pates LOV 06/23/23  Chest x-ray :  EKG : 06/23/23  Echo : 2022  CT Card- 06/04/21  Monitor- 12/19/20  Stress test: Cardiac Cath :   Activity level: Able to climb a flight of stairs with no CP or SOB  Sleep Study/ CPAP : Fasting Blood Sugar :      / Checks Blood Sugar -- times a day:   Blood Thinner/ Instructions /Last Dose: ASA / Instructions/ Last Dose :

## 2023-10-30 NOTE — H&P (Signed)
cc: Gross hematuria and inability to void   HPI: 78 year old man who underwent brachii seed therapy in 2014 presents today with concerns of gross hematuria that started last night. This is associated with frequency, urgency, and mild dysuria. Throughout the course of the evening he developed inability to void. He was urgently worked into my schedule this morning. He is not on any blood thinners. His PVR is 547 mL. He denies fevers, chills, history of kidney stones. He has never had this issue before.   Interval: Eric Dominguez presents today with concerns that his catheter has not been draining.   09/22/23: Eric Dominguez returns today in f/u. The bladder has stopped with the recent irrigation. he is for cystosocpy today. His urine culture was negative.   10/10/2023: Patient returns today after undergoing TURBT on 09/26/2023 with Dr. Annabell Howells. 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 He is scheduled to undergo restaging TURBT with gemcitabine on 10/31/2023. He denies changes to his medical history. Denies gross hematuria, dysuria, flank pain, fever/chills, nausea/vomiting, shortness of breath, chest pains. UA with microscopic abnormalities. PVR 74 cc.     ALLERGIES: Penicillin    MEDICATIONS: Myrbetriq 50 mg tablet, extended release 24 hr 1 tablet PO Daily  Tamsulosin Hcl 0.4 mg capsule 1 capsule PO Daily  Advil TABS PRN  Atorvastatin Calcium 20 mg tablet  Lisinopril 30 mg tablet Oral  Sildenafil Citrate 100 mg tablet 1-2 tablet PO Daily PRN  Tylenol PRN     GU PSH: Cystoscopy - 09/22/2023 TRANSPERI NEEDLE PLACE, PROS - 2014       PSH Notes: Surgery Prostate Transperineal Placement Of Needles, Ankle Surgery, neck and spinal fusion - Jan 2015   NON-GU PSH: Visit Complexity (formerly GPC1X) - 07/16/2023, 01/10/2023     GU PMH: Gross hematuria, 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 I am going to set him up to go to the OR later this week for a more thorough cystoscopy with possible biopsy and fulguration. I reviewed the risks in detail. - 09/22/2023, - 09/18/2023, - 09/16/2023 History of prostate cancer - 09/22/2023, His PSA continues to rise with a PSADT of 12-24 months. Repeat PSA in 6 months. I will restage if he gets to 0.5. , - 07/16/2023, His PSA has stabilized at 0.14. Repeat in 6 months. , - 01/10/2023, His PSA is rising with a shortening PSADT. I will try again to get the PSMA PET ordered. He will continue 3 month PSA f/u. , - 07/12/2022, He is doing well but the PSA continues to rise slowly. I will repeat in 6 months and restage if it gets above 0.2. , - 08/27/2021, He continues to do well but his PSA is rising with a PSADT of about 12-15 months., - 2022, History of prostate cancer, - 2016, History of malignant neoplasm of prostate, - 2016 Radiation cystitis (with hematuria) - 09/22/2023 Urinary Retention (Stable) - 09/18/2023, - 09/16/2023 ED due to arterial insufficiency, He is doing well on sildenafil. - 07/16/2023, sildenafil dispensed., - 01/10/2023, He continues to respond to sildenafil, - 07/12/2022, sildenafil refilled. , - 08/27/2021, He continues to do well with sildenafil. , - 2022, Erectile dysfunction due to arterial insufficiency, - 2015 Nocturia, He has persistent LUTS that is variable but is improved with Myrbetriq and tamsulsoin. - 07/16/2023, - 07/12/2022 Rising PSA after prostate cancer treatment - 07/16/2023, - 07/12/2022, -  08/27/2021, 11-18-20, 11/18/2018 Urinary Urgency - 07/16/2023, He continues to have some urgency but doesn't require pads. , - 07/12/2022, He has some urgency. I have recommended he try to cut back on coffee in the morning to see if that will help. , - 08/27/2021, Urinary urgency, - November 18, 2014 Urge incontinence, The [facility] Peer is not well covered for him. I have given him samples of Myrbetriq 25 and 50mg  with instructions and a  review of the side effects. I also gave him Gemtesa samples should the Myrbetriq not be as effective. - 01/10/2023, He has occasional UUI that is mild. hopefully reducing irritants will help. 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 I encouraged him to use absorbant material at the anus when walking. He was encouraged to see Dr. Marina Goodell. 2017-11-18 Encounter for general adult medical examination without abnormal findings, Encounter for preventive health examination - 11-18-14 Anxiety, Anxiety (Symptom) - November 18, 2012 Personal history of other diseases of the digestive system, History of esophageal reflux - 2012-11-18    FAMILY HISTORY: Death In The Family Mother - Runs In Family Family Health Status Number - Runs In Family Family Health Status Of Father - Alive - Runs In Family Prostate Cancer - Father   SOCIAL HISTORY: Marital Status: Married Preferred Language: English; Ethnicity: Not Hispanic Or Latino; Race: White Current Smoking Status: Patient has never smoked.  Does drink.  Drinks 1 caffeinated drink per day.     Notes: Never A Smoker, Occupation:, Caffeine Use, Alcohol Use   REVIEW OF SYSTEMS:    GU Review Male:   Patient denies frequent urination, hard to postpone urination, burning/ pain with urination, get up at night to urinate, leakage of urine, stream starts and stops, trouble starting your stream, have to strain to urinate , erection problems, and penile pain.  Gastrointestinal (Upper):   Patient denies nausea, vomiting, and indigestion/ heartburn.  Gastrointestinal (Lower):   Patient denies diarrhea and constipation.  Constitutional:   Patient denies fever, night sweats, weight loss, and fatigue.  Skin:   Patient denies skin rash/ lesion and itching.   Eyes:   Patient denies blurred vision and double vision.  Ears/ Nose/ Throat:   Patient denies sore throat and sinus problems.  Hematologic/Lymphatic:   Patient denies swollen glands and easy bruising.  Cardiovascular:   Patient denies leg swelling and chest pains.  Respiratory:   Patient denies cough and shortness of breath.  Endocrine:   Patient denies excessive thirst.  Musculoskeletal:   Patient denies back pain and joint pain.  Neurological:   Patient denies headaches and dizziness.  Psychologic:   Patient denies depression and anxiety.   VITAL SIGNS:      10/10/2023 09:57 AM  Weight 205 lb / 92.99 kg  Height 73 in / 185.42 cm  BP 142/74 mmHg  Heart Rate 55 /min  Temperature 98.4 F / 36.8 C  BMI 27.0 kg/m   MULTI-SYSTEM PHYSICAL EXAMINATION:    Constitutional: Well-nourished. No physical deformities. Normally developed. Good grooming.  Neck: Neck symmetrical, not swollen. Normal tracheal position.  Respiratory: No labored breathing, no use of accessory muscles.   Cardiovascular: Normal temperature, normal extremity pulses, no swelling, no varicosities.  Neurologic / Psychiatric: Oriented to time, oriented to place, oriented to person. No depression, no anxiety, no  agitation.  Gastrointestinal: No mass, no tenderness, no rigidity, non obese abdomen.  Musculoskeletal: Normal gait and station of head and neck.     Complexity of Data:  Source Of History:  Patient, Family/Caregiver, Medical Record Summary  Records Review:   Pathology Reports, Previous Doctor Records, Previous Hospital Records, Previous Patient Records  Urine Test Review:   Urinalysis, Urine Culture  Urodynamics Review:   Review Bladder Scan   07/09/23 01/08/23 10/30/22 07/08/22 04/29/22 08/20/21 02/21/21 04/12/20  PSA  Total PSA 0.22 ng/mL 0.14 ng/mL 0.14 ng/mL 0.16 ng/mL 0.134 ng/ml 0.109 ng/mL 0.082 ng/mL 0.046 ng/mL    10/10/23  Urinalysis  Urine Appearance Slightly Cloudy   Urine Color Yellow   Urine  Glucose Neg mg/dL  Urine Bilirubin Neg mg/dL  Urine Ketones Neg mg/dL  Urine Specific Gravity 1.020   Urine Blood Trace ery/uL  Urine pH 6.5   Urine Protein 1+ mg/dL  Urine Urobilinogen 0.2 mg/dL  Urine Nitrites Neg   Urine Leukocyte Esterase Trace leu/uL  Urine WBC/hpf 10 - 20/hpf   Urine RBC/hpf 3 - 10/hpf   Urine Epithelial Cells 0 - 5/hpf   Urine Bacteria Few (10-25/hpf)   Urine Mucous Present   Urine Yeast NS (Not Seen)   Urine Trichomonas Not Present   Urine Cystals NS (Not Seen)   Urine Casts NS (Not Seen)   Urine Sperm Not Present   Notes:                     Patient, Bactocill, one of our schedules does not matter whenever he being   PROCEDURES:         PVR Ultrasound - 16109  Scanned Volume: 74 cc         Visit Complexity - G2211          Urinalysis w/Scope Dipstick Dipstick Cont'd Micro  Color: Yellow Bilirubin: Neg mg/dL WBC/hpf: 10 - 60/AVW  Appearance: Slightly Cloudy Ketones: Neg mg/dL RBC/hpf: 3 - 09/WJX  Specific Gravity: 1.020 Blood: Trace ery/uL Bacteria: Few (10-25/hpf)  pH: 6.5 Protein: 1+ mg/dL Cystals: NS (Not Seen)  Glucose: Neg mg/dL Urobilinogen: 0.2 mg/dL Casts: NS (Not Seen)    Nitrites: Neg Trichomonas: Not Present    Leukocyte Esterase: Trace leu/uL Mucous: Present      Epithelial Cells: 0 - 5/hpf      Yeast: NS (Not Seen)      Sperm: Not Present    ASSESSMENT:      ICD-10 Details  1 GU:   Bladder tumor/neoplasm - D41.4 Undiagnosed New Problem   PLAN:           Orders Labs Urine Culture          Schedule Return Visit/Planned Activity: Keep Scheduled Appointment - Schedule Surgery          Document Letter(s):  Created for Patient: Clinical Summary         Notes:   There are no changes in the patients history or physical exam since last evaluation. Pt is scheduled to undergo TURBT and gemcitabine on 10/31/2023.   All questions were answered to the best of my ability.         Next Appointment:      Next Appointment:  10/31/2023 12:15 PM    Appointment Type: Surgery     Location: [facility] [phone]    Provider: Bjorn Pippin, M.D.    Reason for Visit: OP--[facility]--CY, RESTAGE TURBT/GEMCITABINE

## 2023-10-30 NOTE — Progress Notes (Signed)
 Pt aware to arrive at [facility] admitting at 0830 on Friday 10/31/2023 for scheduled surgical procedure. No food after midnight; clear liquids from midnight till 0745 then nothing by mouth.

## 2023-10-31 ENCOUNTER — Ambulatory Visit (HOSPITAL_COMMUNITY)
Admission: RE | Admit: 2023-10-31 | Discharge: 2023-10-31 | Disposition: A | Discharge status: Home / Self Care | Payer: Medicare Other | Source: Ambulatory Visit | Attending: Urology | Admitting: Urology

## 2023-10-31 ENCOUNTER — Ambulatory Visit (HOSPITAL_COMMUNITY): Payer: Medicare Other

## 2023-10-31 ENCOUNTER — Encounter (HOSPITAL_COMMUNITY): Payer: Self-pay | Admitting: Urology

## 2023-10-31 ENCOUNTER — Encounter (HOSPITAL_COMMUNITY)
Admission: RE | Disposition: A | Discharge status: Home / Self Care | Payer: Self-pay | Source: Ambulatory Visit | Attending: Urology

## 2023-10-31 ENCOUNTER — Ambulatory Visit (HOSPITAL_BASED_OUTPATIENT_CLINIC_OR_DEPARTMENT_OTHER): Payer: Medicare Other

## 2023-10-31 DIAGNOSIS — Z01818 Encounter for other preprocedural examination: Secondary | ICD-10-CM

## 2023-10-31 DIAGNOSIS — I10 Essential (primary) hypertension: Secondary | ICD-10-CM

## 2023-10-31 DIAGNOSIS — K219 Gastro-esophageal reflux disease without esophagitis: Secondary | ICD-10-CM | POA: Diagnosis not present

## 2023-10-31 DIAGNOSIS — D414 Neoplasm of uncertain behavior of bladder: Secondary | ICD-10-CM | POA: Diagnosis present

## 2023-10-31 DIAGNOSIS — C679 Malignant neoplasm of bladder, unspecified: Secondary | ICD-10-CM

## 2023-10-31 DIAGNOSIS — C675 Malignant neoplasm of bladder neck: Secondary | ICD-10-CM

## 2023-10-31 SURGERY — TURBT, WITH CHEMOTHERAPEUTIC AGENT INSTILLATION INTO BLADDER
Anesthesia: General

## 2023-10-31 MED ORDER — LACTATED RINGERS IV SOLN: INTRAVENOUS | Status: DC | PRN

## 2023-10-31 MED ORDER — LACTATED RINGERS IV SOLN: INTRAVENOUS | Status: DC

## 2023-10-31 MED ORDER — ROCURONIUM BROMIDE 100 MG/10ML IV SOLN
INTRAVENOUS | Status: DC | PRN
  Administered 2023-10-31: 60 mg via INTRAVENOUS

## 2023-10-31 MED ORDER — DROPERIDOL 2.5 MG/ML IJ SOLN: 0.6250 mg | Freq: Once | INTRAMUSCULAR | Status: DC | PRN

## 2023-10-31 MED ORDER — DEXAMETHASONE SODIUM PHOSPHATE 10 MG/ML IJ SOLN
INTRAMUSCULAR | Status: AC
  Filled 2023-10-31: qty 1

## 2023-10-31 MED ORDER — HYDROCODONE-ACETAMINOPHEN 5-325 MG PO TABS: 1.0000 | ORAL_TABLET | Freq: Four times a day (QID) | ORAL | 0 refills | Status: DC | PRN

## 2023-10-31 MED ORDER — SODIUM CHLORIDE 0.9% FLUSH: 3.0000 mL | Freq: Two times a day (BID) | INTRAVENOUS | Status: DC

## 2023-10-31 MED ORDER — PROPOFOL 10 MG/ML IV BOLUS
INTRAVENOUS | Status: DC | PRN
  Administered 2023-10-31: 150 mg via INTRAVENOUS

## 2023-10-31 MED ORDER — FENTANYL CITRATE PF 50 MCG/ML IJ SOSY: 25.0000 ug | PREFILLED_SYRINGE | INTRAMUSCULAR | Status: DC | PRN

## 2023-10-31 MED ORDER — FENTANYL CITRATE (PF) 100 MCG/2ML IJ SOLN
INTRAMUSCULAR | Status: DC | PRN
  Administered 2023-10-31: 50 ug via INTRAVENOUS
  Administered 2023-10-31 (×2): 25 ug via INTRAVENOUS

## 2023-10-31 MED ORDER — OXYCODONE HCL 5 MG/5ML PO SOLN: 5.0000 mg | Freq: Once | ORAL | Status: DC | PRN

## 2023-10-31 MED ORDER — SODIUM CHLORIDE 0.9 % IR SOLN
Status: DC | PRN
  Administered 2023-10-31: 3000 mL

## 2023-10-31 MED ORDER — PROPOFOL 10 MG/ML IV BOLUS
INTRAVENOUS | Status: AC
  Filled 2023-10-31: qty 20

## 2023-10-31 MED ORDER — ACETAMINOPHEN 10 MG/ML IV SOLN: 1000.0000 mg | Freq: Once | INTRAVENOUS | Status: DC | PRN

## 2023-10-31 MED ORDER — ONDANSETRON HCL 4 MG/2ML IJ SOLN
INTRAMUSCULAR | Status: AC
  Filled 2023-10-31: qty 2

## 2023-10-31 MED ORDER — DEXAMETHASONE SODIUM PHOSPHATE 4 MG/ML IJ SOLN
INTRAMUSCULAR | Status: DC | PRN
  Administered 2023-10-31: 5 mg via INTRAVENOUS

## 2023-10-31 MED ORDER — CHLORHEXIDINE GLUCONATE 0.12 % MT SOLN
15.0000 mL | Freq: Once | OROMUCOSAL | Status: AC
  Administered 2023-10-31: 15 mL via OROMUCOSAL

## 2023-10-31 MED ORDER — ORAL CARE MOUTH RINSE: 15.0000 mL | Freq: Once | OROMUCOSAL | Status: AC

## 2023-10-31 MED ORDER — ONDANSETRON HCL 4 MG/2ML IJ SOLN
INTRAMUSCULAR | Status: DC | PRN
  Administered 2023-10-31: 4 mg via INTRAVENOUS

## 2023-10-31 MED ORDER — OXYCODONE HCL 5 MG PO TABS: 5.0000 mg | ORAL_TABLET | Freq: Once | ORAL | Status: DC | PRN

## 2023-10-31 MED ORDER — LIDOCAINE HCL (CARDIAC) PF 100 MG/5ML IV SOSY
PREFILLED_SYRINGE | INTRAVENOUS | Status: DC | PRN
  Administered 2023-10-31: 100 mg via INTRAVENOUS

## 2023-10-31 MED ORDER — ACETAMINOPHEN 500 MG PO TABS
1000.0000 mg | ORAL_TABLET | Freq: Once | ORAL | Status: AC
  Administered 2023-10-31: 1000 mg via ORAL
  Filled 2023-10-31: qty 2

## 2023-10-31 MED ORDER — SUGAMMADEX SODIUM 200 MG/2ML IV SOLN
INTRAVENOUS | Status: DC | PRN
  Administered 2023-10-31: 200 mg via INTRAVENOUS

## 2023-10-31 MED ORDER — GEMCITABINE CHEMO FOR BLADDER INSTILLATION 2000 MG
2000.0000 mg | Freq: Once | INTRAVENOUS | Status: AC
  Administered 2023-10-31: 2000 mg via INTRAVESICAL
  Filled 2023-10-31: qty 2000

## 2023-10-31 MED ORDER — FENTANYL CITRATE (PF) 100 MCG/2ML IJ SOLN
INTRAMUSCULAR | Status: AC
  Filled 2023-10-31: qty 2

## 2023-10-31 MED ORDER — CIPROFLOXACIN IN D5W 400 MG/200ML IV SOLN
400.0000 mg | INTRAVENOUS | Status: AC
  Administered 2023-10-31: 400 mg via INTRAVENOUS
  Filled 2023-10-31: qty 200

## 2023-10-31 MED ORDER — STERILE WATER FOR IRRIGATION IR SOLN
Status: DC | PRN
  Administered 2023-10-31: 3000 mL

## 2023-10-31 MED ORDER — LIDOCAINE HCL (PF) 2 % IJ SOLN
INTRAMUSCULAR | Status: AC
  Filled 2023-10-31: qty 5

## 2023-10-31 MED ORDER — ROCURONIUM BROMIDE 10 MG/ML (PF) SYRINGE
PREFILLED_SYRINGE | INTRAVENOUS | Status: AC
  Filled 2023-10-31: qty 10

## 2023-10-31 SURGICAL SUPPLY — 22 items
BAG URINE DRAIN 2000ML AR STRL (UROLOGICAL SUPPLIES) IMPLANT
BAG URO CATCHER STRL LF (MISCELLANEOUS) ×2 IMPLANT
CATH FOLEY 2WAY SLVR 5CC 18FR (CATHETERS) IMPLANT
CATH FOLEY 3WAY 30CC 22FR (CATHETERS) IMPLANT
CATH URETL OPEN 5X70 (CATHETERS) IMPLANT
DRAPE FOOT SWITCH (DRAPES) ×2 IMPLANT
ELECT REM PT RETURN 15FT ADLT (MISCELLANEOUS) ×2 IMPLANT
GLOVE SURG SS PI 8.0 STRL IVOR (GLOVE) ×2 IMPLANT
GOWN STRL REUS W/ TWL XL LVL3 (GOWN DISPOSABLE) ×2 IMPLANT
HOLDER FOLEY CATH W/STRAP (MISCELLANEOUS) IMPLANT
KIT TURNOVER KIT A (KITS) IMPLANT
LOOP CUT BIPOLAR 24F LRG (ELECTROSURGICAL) IMPLANT
LOOP MONOPOLAR YLW (ELECTROSURGICAL) IMPLANT
MANIFOLD NEPTUNE II (INSTRUMENTS) ×2 IMPLANT
PACK CYSTO (CUSTOM PROCEDURE TRAY) ×2 IMPLANT
PAD ELECT DISPERSIVE GRND ADPT (ELECTRODE) IMPLANT
SUT ETHILON 3 0 PS 1 (SUTURE) IMPLANT
SYR 30ML LL (SYRINGE) IMPLANT
SYR TOOMEY IRRIG 70ML (MISCELLANEOUS)
SYRINGE TOOMEY IRRIG 70ML (MISCELLANEOUS) IMPLANT
TUBING CONNECTING 10 (TUBING) ×2 IMPLANT
TUBING UROLOGY SET (TUBING) ×2 IMPLANT

## 2023-10-31 NOTE — Discharge Instructions (Addendum)
You may remove the catheter on Monday morning but if you don't feel you can do that, please contact the office.

## 2023-10-31 NOTE — Anesthesia Preprocedure Evaluation (Signed)
Anesthesia Evaluation  Patient identified by MRN, date of birth, ID band Patient awake    Reviewed: Allergy & Precautions, H&P , NPO status , Patient's Chart, lab work & pertinent test results  Airway Mallampati: II  TM Distance: >3 FB Neck ROM: Full    Dental no notable dental hx.    Pulmonary neg pulmonary ROS   Pulmonary exam normal breath sounds clear to auscultation       Cardiovascular hypertension, Normal cardiovascular exam Rhythm:Regular Rate:Normal     Neuro/Psych negative neurological ROS  negative psych ROS   GI/Hepatic ,GERD  ,,(+) Hepatitis -  Endo/Other  negative endocrine ROS    Renal/GU negative Renal ROS   Hx of bladder and prostate cancer negative genitourinary   Musculoskeletal negative musculoskeletal ROS (+)    Abdominal   Peds negative pediatric ROS (+)  Hematology negative hematology ROS (+)   Anesthesia Other Findings   Reproductive/Obstetrics negative OB ROS                              Anesthesia Physical Anesthesia Plan  ASA: 3  Anesthesia Plan: General   Post-op Pain Management: Tylenol PO (pre-op)*   Induction: Intravenous  PONV Risk Score and Plan: 2 and Ondansetron and Dexamethasone  Airway Management Planned: Oral ETT  Additional Equipment:   Intra-op Plan:   Post-operative Plan: Extubation in OR  Informed Consent: I have reviewed the patients History and Physical, chart, labs and discussed the procedure including the risks, benefits and alternatives for the proposed anesthesia with the patient or authorized representative who has indicated his/her understanding and acceptance.     Dental advisory given  Plan Discussed with: CRNA  Anesthesia Plan Comments:          Anesthesia Quick Evaluation

## 2023-10-31 NOTE — Op Note (Signed)
Procedure: 1.  Cystoscopy with restaging transurethral resection of bladder tumor, 2 cm. 2.  Instillation of gemcitabine, in PACU.  Preop diagnosis: High-grade T1 nonmuscle invasive bladder cancer.  Postop diagnosis: Same.  Surgeon: Dr. Bjorn Pippin.  Anesthesia: General.  Specimen: Bladder tumor chips.  Drains: 18 French Foley catheter.  EBL: None.  Complications: None.  Indications: The patient is a 78 year old male who was found on 09/26/2023 to have a high-grade T1 nonmuscle invasive bladder cancer on TURBT.  There was no muscle in the specimen it was felt that reresection was indicated.  Procedure: He was taken the operating room was given antibiotic.  A general anesthetic was induced and he was given a paralytic.  His perineum and genitalia were prepped with Betadine solution he was draped in usual sterile fashion  The 26 French continuous-flow resectoscope sheath was placed today to the visual obturator and 30 degree lens.  Examination revealed a normal urethra.  The prostatic urethra was short without obstruction but with radiation changes.  Examination the bladder demonstrated mild to moderate trabeculation with the prior resection site lateral to the left ureteral orifice at the bladder neck.  There were dystrophic changes in the prior resection site and some possible papillary changes on the superior margin of the lesion.  The remainder of the bladder mucosa was unremarkable.  The ureteral orifices were unremarkable.  The monopolar resectoscope was then passed and sterile water was used as the irrigant.  The resection site was then reresected with a wider margin and was carried into the muscularis with some fat visible but no significant perforation.  Once area been fully resected and the chips had been removed, the resection bed was generously fulgurated along with a margin of surrounding mucosa.  The left ureteral orifice was spared.  Final inspection revealed no retained chips or  bleeding.  The scope was removed and an 30 French Foley catheter was placed.  The balloon was filled with 10 mL of sterile fluid.  The cath was irrigated with clear return and placed to straight drainage.  He was taken down from lithotomy position, his anesthetic was reversed and he was moved recovery room in stable condition.  2000 mg of gemcitabine was instilled into the bladder and 50 mL of diluent in the PACU.  This was left indwelling for an hour before the catheter was placed back to drainage.  There were no complications.

## 2023-10-31 NOTE — Anesthesia Postprocedure Evaluation (Signed)
Anesthesia Post Note  Patient: Eric Dominguez  Procedure(s) Performed: RESTAGING TRANSURETHRAL RESECTION OF BLADDER TUMOR (TURBT) with GEMCITABINE     Patient location during evaluation: PACU Anesthesia Type: General Level of consciousness: awake and alert Pain management: pain level controlled Vital Signs Assessment: post-procedure vital signs reviewed and stable Respiratory status: spontaneous breathing, nonlabored ventilation, respiratory function stable and patient connected to nasal cannula oxygen Cardiovascular status: blood pressure returned to baseline and stable Postop Assessment: no apparent nausea or vomiting Anesthetic complications: no   No notable events documented.  Last Vitals:  Vitals:   10/31/23 1315 10/31/23 1331  BP: (!) 155/74 (!) 161/81  Pulse: (!) 50 (!) 50  Resp: 18 17  Temp:  36.5 C  SpO2: 98% 95%    Last Pain:  Vitals:   10/31/23 1331  TempSrc:   PainSc: 0-No pain                 [place]

## 2023-10-31 NOTE — Interval H&P Note (Signed)
History and Physical Interval Note:   10/31/2023 10:19 AM  Eric Dominguez  has presented today for surgery, with the diagnosis of BLADDER CANCER.  The various methods of treatment have been discussed with the patient and family. After consideration of risks, benefits and other options for treatment, the patient has consented to  Procedure(s) with comments: RESTAGING TRANSURETHRAL RESECTION OF BLADDER TUMOR (TURBT) with GEMCITABINE (N/A) - 60 MINUTE CASE as a surgical intervention.  The patient's history has been reviewed, patient examined, no change in status, stable for surgery.  I have reviewed the patient's chart and labs.  Questions were answered to the patient's satisfaction.     Bjorn Pippin

## 2023-10-31 NOTE — Transfer of Care (Signed)
Immediate Anesthesia Transfer of Care Note  Patient: Eric Dominguez  Procedure(s) Performed: RESTAGING TRANSURETHRAL RESECTION OF BLADDER TUMOR (TURBT) with GEMCITABINE  Patient Location: PACU  Anesthesia Type:General  Level of Consciousness: awake and alert   Airway & Oxygen Therapy: Patient Spontanous Breathing and Patient connected to face mask oxygen  Post-op Assessment: Report given to RN and Post -op Vital signs reviewed and stable  Post vital signs: Reviewed and stable  Last Vitals:  Vitals Value Taken Time  BP 139/69 10/31/23 1130  Temp    Pulse 52 10/31/23 1131  Resp 11 10/31/23 1131  SpO2 97 % 10/31/23 1131  Vitals shown include unfiled device data.  Last Pain:  Vitals:   10/31/23 0855  TempSrc:   PainSc: 0-No pain         Complications: No notable events documented.

## 2023-10-31 NOTE — Anesthesia Procedure Notes (Signed)
Procedure Name: Intubation Date/Time: 10/31/2023 10:44 AM  Performed by: Deri Fuelling, CRNAPre-anesthesia Checklist: Patient identified, Emergency Drugs available, Suction available and Patient being monitored Patient Re-evaluated:Patient Re-evaluated prior to induction Oxygen Delivery Method: Circle system utilized Preoxygenation: Pre-oxygenation with 100% oxygen Induction Type: IV induction Ventilation: Mask ventilation without difficulty Laryngoscope Size: Mac and 4 Grade View: Grade I Tube type: Oral Number of attempts: 1 Airway Equipment and Method: Stylet and Oral airway Placement Confirmation: ETT inserted through vocal cords under direct vision, positive ETCO2 and breath sounds checked- equal and bilateral Secured at: 22 cm Tube secured with: Tape Dental Injury: Teeth and Oropharynx as per pre-operative assessment

## 2023-11-03 LAB — SURGICAL PATHOLOGY

## 2024-06-07 ENCOUNTER — Other Ambulatory Visit: Payer: Self-pay | Admitting: Urology

## 2024-06-15 ENCOUNTER — Encounter (HOSPITAL_COMMUNITY)
Admission: RE | Admit: 2024-06-15 | Discharge: 2024-06-15 | Disposition: A | Discharge status: Home / Self Care | Source: Ambulatory Visit | Attending: Urology | Admitting: Urology

## 2024-06-15 ENCOUNTER — Other Ambulatory Visit: Payer: Self-pay

## 2024-06-15 ENCOUNTER — Encounter (HOSPITAL_COMMUNITY): Payer: Self-pay

## 2024-06-15 VITALS — BP 98/60 | HR 70 | Temp 97.7°F | Resp 16 | Ht 72.0 in | Wt 210.0 lb

## 2024-06-15 DIAGNOSIS — Z01812 Encounter for preprocedural laboratory examination: Secondary | ICD-10-CM | POA: Insufficient documentation

## 2024-06-15 DIAGNOSIS — I10 Essential (primary) hypertension: Secondary | ICD-10-CM | POA: Diagnosis not present

## 2024-06-15 LAB — CBC
HCT: 40.8 % (ref 39.0–52.0)
Hemoglobin: 13.3 g/dL (ref 13.0–17.0)
MCH: 31.4 pg (ref 26.0–34.0)
MCHC: 32.6 g/dL (ref 30.0–36.0)
MCV: 96.2 fL (ref 80.0–100.0)
Platelets: 215 K/uL (ref 150–400)
RBC: 4.24 MIL/uL (ref 4.22–5.81)
RDW: 13.8 % (ref 11.5–15.5)
WBC: 6.1 K/uL (ref 4.0–10.5)
nRBC: 0 % (ref 0.0–0.2)

## 2024-06-15 LAB — BASIC METABOLIC PANEL WITH GFR
Anion gap: 13 (ref 5–15)
BUN: 21 mg/dL (ref 8–23)
CO2: 22 mmol/L (ref 22–32)
Calcium: 9.5 mg/dL (ref 8.9–10.3)
Chloride: 104 mmol/L (ref 98–111)
Creatinine, Ser: 0.95 mg/dL (ref 0.61–1.24)
GFR, Estimated: >60 mL/min (ref >60)
Glucose, Bld: 96 mg/dL (ref 70–99)
Potassium: 4.4 mmol/L (ref 3.5–5.1)
Sodium: 138 mmol/L (ref 135–145)

## 2024-06-15 NOTE — Patient Instructions (Addendum)
 SURGICAL WAITING ROOM VISITATION  Patients having surgery or a procedure may have no more than 2 support people in the waiting area - these visitors may rotate.    Children under the age of 35 must have an adult with them who is not the patient.  Visitors with respiratory illnesses are discouraged from visiting and should remain at home.  If the patient needs to stay at the hospital during part of their recovery, the visitor guidelines for inpatient rooms apply. Pre-op nurse will coordinate an appropriate time for 1 support person to accompany patient in pre-op.  This support person may not rotate.    Please refer to the [facility] website for the visitor guidelines for Inpatients (after your surgery is over and you are in a regular room).     Your procedure is scheduled on: 06/18/24   Report to [facility] Main Entrance    Report to admitting at 10:25 AM   Call this number if you have problems the morning of surgery [phone]   Do not eat food or drink liquids :After Midnight.         If you have questions, please contact your surgeon's office.   FOLLOW BOWEL PREP AND ANY ADDITIONAL PRE OP INSTRUCTIONS YOU RECEIVED FROM YOUR SURGEON'S OFFICE!!!     Oral Hygiene is also important to reduce your risk of infection.                                    Remember - BRUSH YOUR TEETH THE MORNING OF SURGERY WITH YOUR REGULAR TOOTHPASTE  DENTURES WILL BE REMOVED PRIOR TO SURGERY PLEASE DO NOT APPLY Poly grip OR ADHESIVES!!!   Stop all vitamins and herbal supplements 7 days before surgery.   Take these medicines the morning of surgery with A SIP OF WATER : Zyrtec if needed                              You may not have any metal on your body including hair pins, jewelry, and body piercing             Do not wear lotions, powders, cologne, or deodorant              Men may shave face and neck.   Do not bring valuables to the hospital. [facility] IS NOT              RESPONSIBLE   FOR VALUABLES.   Contacts, glasses, dentures or bridgework may not be worn into surgery.  DO NOT BRING YOUR HOME MEDICATIONS TO THE HOSPITAL. PHARMACY WILL DISPENSE MEDICATIONS LISTED ON YOUR MEDICATION LIST TO YOU DURING YOUR ADMISSION IN THE HOSPITAL!    Patients discharged on the day of surgery will not be allowed to drive home.  Someone NEEDS to stay with you for the first 24 hours after anesthesia.   Special Instructions: Bring a copy of your healthcare power of attorney and living will documents the day of surgery if you haven't scanned them before.              Please read over the following fact sheets you were given: IF YOU HAVE QUESTIONS ABOUT YOUR PRE-OP INSTRUCTIONS PLEASE CALL [phone].   If you received a COVID test during your pre-op visit  it is requested that you wear a mask when out  in public, stay away from anyone that may not be feeling well and notify your surgeon if you develop symptoms. If you test positive for Covid or have been in contact with anyone that has tested positive in the last 10 days please notify you surgeon.    [place] - Preparing for Surgery Before surgery, you can play an important role.  Because skin is not sterile, your skin needs to be as free of germs as possible.  You can reduce the number of germs on your skin by washing with CHG (chlorahexidine gluconate) soap before surgery.  CHG is an antiseptic cleaner which kills germs and bonds with the skin to continue killing germs even after washing. Please DO NOT use if you have an allergy to CHG or antibacterial soaps.  If your skin becomes reddened/irritated stop using the CHG and inform your nurse when you arrive at Short Stay. Do not shave (including legs and underarms) for at least 48 hours prior to the first CHG shower.  You may shave your face/neck.  Please follow these instructions carefully:  1.  Shower with CHG Soap the night before surgery and the morning of  surgery.  2.  If you choose to wash your hair, wash your hair first as usual with your normal  shampoo.  3.  After you shampoo, rinse your hair and body thoroughly to remove the shampoo.                             4.  Use CHG as you would any other liquid soap.  You can apply chg directly to the skin and wash.  Gently with a scrungie or clean washcloth.  5.  Apply the CHG Soap to your body ONLY FROM THE NECK DOWN.   Do   not use on face/ open                           Wound or open sores. Avoid contact with eyes, ears mouth and   genitals (private parts).                       Wash face,  Genitals (private parts) with your normal soap.             6.  Wash thoroughly, paying special attention to the area where your    surgery  will be performed.  7.  Thoroughly rinse your body with warm water  from the neck down.  8.  DO NOT shower/wash with your normal soap after using and rinsing off the CHG Soap.                9.  Pat yourself dry with a clean towel.            10.  Wear clean pajamas.            11.  Place clean sheets on your bed the night of your first shower and do not  sleep with pets. Day of Surgery : Do not apply any CHG, lotions/deodorants the morning of surgery.  Please wear clean clothes to the hospital/surgery center.  FAILURE TO FOLLOW THESE INSTRUCTIONS MAY RESULT IN THE CANCELLATION OF YOUR SURGERY  PATIENT SIGNATURE_________________________________  NURSE SIGNATURE__________________________________  ________________________________________________________________________

## 2024-06-15 NOTE — Progress Notes (Addendum)
 COVID Vaccine Completed: yes  Date of COVID positive in last 90 days:  PCP - Elsie Gentry, MD Cardiologist - Vina Gull, MD LOV 06/23/23 Epic  Chest x-ray - N/A EKG - 06/23/23 Epic Stress Test - N/A ECHO - 12/01/20 Epic Cardiac Cath -  Pacemaker/ICD device last checked:N/A Spinal Cord Stimulator:N/A  Bowel Prep - N/A  Sleep Study - N/A CPAP -   Fasting Blood Sugar - N/A Checks Blood Sugar _____ times a day  Last dose of GLP1 agonist-  N/A GLP1 instructions:  Do not take after     Last dose of SGLT-2 inhibitors-  N/A SGLT-2 instructions:  Do not take after     Blood Thinner Instructions: N/A Last dose:   Time: Aspirin  Instructions:N/A Last Dose:  Activity level: Can go up a flight of stairs and perform activities of daily living without stopping and without symptoms of chest pain or shortness of breath.  Anesthesia review: CAD, HTN, RBBB and first degree block  Patient denies shortness of breath, fever, cough and chest pain at PAT appointment  Patient verbalized understanding of instructions that were given to them at the PAT appointment. Patient was also instructed that they will need to review over the PAT instructions again at home before surgery.

## 2024-06-15 NOTE — Hospital Course (Signed)
 SABRA

## 2024-06-15 NOTE — H&P (Signed)
 cc: Gross hematuria and inability to void   HPI: 78 year old man who underwent brachii seed therapy in 2014 presents today with concerns of gross hematuria that started last night. This is associated with frequency, urgency, and mild dysuria. Throughout the course of the evening he developed inability to void. He was urgently worked into my schedule this morning. He is not on any blood thinners. His PVR is 547 mL. He denies fevers, chills, history of kidney stones. He has never had this issue before.   Interval: [name] presents today with concerns that his catheter has not been draining.   09/22/23: Eric Dominguez returns today in f/u. The bladder has stopped with the recent irrigation. he is for cystosocpy today. His urine culture was negative.   10/10/2023: Patient returns today after undergoing TURBT on 09/26/2023 with Dr. Watt. 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 He is scheduled to undergo restaging TURBT with gemcitabine  on 10/31/2023. He denies changes to his medical history. Denies gross hematuria, dysuria, flank pain, fever/chills, nausea/vomiting, shortness of breath, chest pains. UA with microscopic abnormalities. PVR 74 cc.   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   02/16/24: Eric Dominguez returns today in f/u for cystoscopy for his history of T1 HG NMIBC treated with a TURBT, restaging and induction BCG. His UA is unremarkable today. His PSA is down slightly to 0.21. He is doing well 6 weeks out from his last induction treatment. His IPSS is 13 with nocturia x 2 and some urgency.   05/24/24: Eric Dominguez returns today in f/u for his history of T1c HG NMIBC with his last resection on 10/31/23. He had induction BCG last on 01/06/24. His UA is unremarkable. His IPSS is 9 with nocturia x 2. He will have some urgency after sitting for a while. He has had no hematuria.     He was found to have some erythematous areas in  the bladder with comedo changes on his cystoscopy on 9/15.  Subsequent cytology showed atypia with an abnormal FISH so a biopsy was felt to be indicated.   AUA Symptom Score: Less than 20% of the time he has the sensation of not emptying his bladder completely when finished urinating. Less than 20% of the time he has to urinate again fewer than two hours after he has finished urinating. Less than 50% of the time he has to start and stop again several times when he urinates. 50% of the time he finds it difficult to postpone urination. He never has a weak urinary stream. He never has to push or strain to begin urination. He has to get up to urinate 2 times from the time he goes to bed until the time he gets up in the morning.   Calculated AUA Symptom Score: 9    ALLERGIES: Penicillin    MEDICATIONS: Advil TABS PRN  Lisinopril  30 MG Tablet Oral  Sildenafil Citrate 100 MG Tablet 1-2 tablet PO Daily PRN  Tylenol  PRN     GU PSH: Bladder Instill AntiCA Agent - 01/06/2024, 12/30/2023, 12/23/2023, 12/17/2023, 12/08/2023, 12/01/2023 Cystoscopy - 02/16/2024, 09/22/2023 TRANSPERI NEEDLE PLACE, PROS - 2014       PSH Notes: Surgery Prostate Transperineal Placement Of Needles, Ankle Surgery, neck and spinal fusion - Jan 2015   NON-GU PSH: Visit Complexity (formerly GPC1X) - 11/25/2023, 10/10/2023, 07/16/2023, 01/10/2023     GU PMH: History of bladder cancer - 02/16/2024 History of prostate cancer -  02/16/2024, - 09/22/2023, His PSA continues to rise with a PSADT of 12-24 months. Repeat PSA in 6 months. I will restage if he gets to 0.5. , - 07/16/2023, His PSA has stabilized at 0.14. Repeat in 6 months. , - 01/10/2023, His PSA is rising with a shortening PSADT. I will try again to get the PSMA PET ordered. He will continue 3 month PSA f/u. , - 07/12/2022, He is doing well but the PSA continues to rise slowly. I will repeat in 6 months and restage if it gets above 0.2. , 2021/07/16, He continues to do well but his PSA is rising  with a PSADT of about 12-15 months., 2021/07/16, History of prostate cancer, - 2015-07-17, History of malignant neoplasm of prostate, - Jul 17, 2015 Bladder Cancer Lateral - 01/06/2024, - 12/30/2023, - 12/23/2023, - 12/17/2023, - 12/08/2023, - 12/01/2023, HE has a history of T1 HG NMIBC s/p restaging resection. He will commence BCG therapy next week for 6 weeks and return in June for cystoscopy. I have reviewed BCG therapy and the alternatives and discussed the procedure and side effects in detail. 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 I am going to set him up to go to the OR later this week for a more thorough cystoscopy with possible biopsy and fulguration. I reviewed the risks in detail. 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 - 07/16/2023, sildenafil dispensed., - 01/10/2023, He continues to respond to sildenafil, - 07/12/2022, sildenafil refilled. , 2021/07/16, He continues to do well with sildenafil. , July 16, 2021, Erectile dysfunction due to arterial insufficiency, - 2015 Nocturia, He has persistent LUTS that is variable but is improved with Myrbetriq and tamsulsoin. - 07/16/2023, - 07/12/2022 Rising PSA after prostate cancer treatment - 07/16/2023, - 07/12/2022, 2021/07/16, 07-16-2021, 2019/07/17 Urinary Urgency - 07/16/2023, He continues to have some urgency but doesn't require pads. , - 07/12/2022, He has some urgency. I have recommended he try to cut back on coffee in the morning to see if that will help. , 07-16-2021, Urinary urgency, - 17-Jul-2015 Urge incontinence, [facility] [place] is not well covered for him. I have given him samples of Myrbetriq 25 and 50mg  with instructions and a review of the side  effects. I also gave him Gemtesa samples should the Myrbetriq not be as effective. - 01/10/2023, He has occasional UUI that is mild. hopefully reducing irritants will help. , 16-Jul-2021 Elevated PSA, Elevated prostate specific antigen (PSA) - 07-17-2015 Bladder-neck stenosis/contracture, Bladder neck contracture - 2014/07/16 Prostate nodule w/o LUTS, Nodular prostate without lower urinary tract symptoms - 07/16/2014    NON-GU PMH: Fecal smearing, He has mildly reduced tone. I encouraged him to use absorbant material at the anus when walking. He was encouraged to see Dr. Abran. 07-16-2018 Encounter for general adult medical examination without abnormal findings, Encounter for preventive health examination - 07/17/2015 Anxiety, Anxiety (Symptom) - Jul 16, 2013 Personal history of other diseases of the digestive system, History of esophageal reflux - Jul 16, 2013    FAMILY HISTORY: Death In The Family Mother - Runs In Family Family Health Status Number - Runs In Family Family Health Status Of Father - Alive - Runs In  Family Prostate Cancer - Father   SOCIAL HISTORY: Marital Status: Married Preferred Language: English; Ethnicity: Not Hispanic Or Latino; Race: White Current Smoking Status: Patient has never smoked.  Does drink.  Drinks 1 caffeinated drink per day.     Notes: Never A Smoker, Occupation:, Caffeine Use, Alcohol Use   REVIEW OF SYSTEMS:    GU Review Male:   Patient reports leakage of urine, frequent urination, and get up at night to urinate. Patient denies erection problems, penile pain, trouble starting your stream, have to strain to urinate , stream starts and stops, burning/ pain with urination, and hard to postpone urination.  Gastrointestinal (Upper):   Patient denies nausea, vomiting, and indigestion/ heartburn.  Gastrointestinal (Lower):   Patient denies diarrhea and constipation.  Constitutional:   Patient denies fever, night sweats, weight loss, and fatigue.  Skin:   Patient denies skin rash/ lesion and itching.   Eyes:   Patient denies blurred vision and double vision.  Ears/ Nose/ Throat:   Patient denies sore throat and sinus problems.  Hematologic/Lymphatic:   Patient denies swollen glands and easy bruising.  Cardiovascular:   Patient denies leg swelling and chest pains.  Respiratory:   Patient denies cough and shortness of breath.  Endocrine:   Patient denies excessive thirst.  Musculoskeletal:   Patient denies back pain and joint pain.  Neurological:   Patient denies headaches and dizziness.  Psychologic:   Patient denies depression and anxiety.   Notes: Urgency    VITAL SIGNS: None   Complexity of Data:  Records Review:   AUA Symptom Score, Previous Patient Records  Urine Test Review:   Urinalysis   02/09/24 07/09/23 01/08/23 10/30/22 07/08/22 04/29/22 08/20/21 02/21/21  PSA  Total PSA 0.21 ng/mL 0.22 ng/mL 0.14 ng/mL 0.14 ng/mL 0.16 ng/mL 0.134 ng/ml 0.109 ng/mL 0.082 ng/mL    PROCEDURES:         Flexible Cystoscopy - 52000  Risks, benefits, and some of the potential complications of the procedure were discussed. He was prepped with betadine  and the urethral was instilled with 2% lidocaine  jelly. Cipro  500mg  given for antibiotic prophylaxis.     Meatus:  Normal size. Normal location. Normal condition.  Urethra:  No strictures.  External Sphincter:  Normal.  Verumontanum:  Normal.  Prostate:  Partially resected prostate fossa. Non-obstructing. No hyperplasia. There is some necrosis of the mid prostatic urethra.  Bladder Neck:  Non-obstructing.  Ureteral Orifices:  Normal location. Normal size. Normal shape. Effluxed clear urine.  Bladder:  Erythematous mucosa patchy on the dome, right lateral wall and left BN with comidos. Appears most consistent with BCG effect. . No trabeculation. No tumors. No stones.      Saline was used as the irrigant and a barbotage cytology was obtained.   The procedure was well tolerated and there were no complications.         Urinalysis  w/Scope Dipstick Dipstick Cont'd Micro  Color: Yellow Bilirubin: Neg mg/dL WBC/hpf: 0 - 5/hpf  Appearance: Clear Ketones: Neg mg/dL RBC/hpf: 0 - 2/hpf  Specific Gravity: 1.025 Blood: 1+ ery/uL Bacteria: Rare (0-9/hpf)  pH: 6.0 Protein: 1+ mg/dL Cystals: NS (Not Seen)  Glucose: Neg mg/dL Urobilinogen: 0.2 mg/dL Casts: NS (Not Seen)    Nitrites: Neg Trichomonas: Not Present    Leukocyte Esterase: Neg leu/uL Mucous: Not Present      Epithelial Cells: NS (Not Seen)      Yeast: NS (Not Seen)      Sperm: Not Present    Notes:  micro performed on unspun urine due to QNS    ASSESSMENT:      ICD-10 Details  1 GU:   History of bladder cancer - Z85.51 Chronic, Stable - He has some inflammatory changes on the bladder wall that are most consistent with BCG effect but I have obtained a specimen for a barbotage cytology and if there is atypia or suspicious cells we will need to consider a biopsy, otherwise he will return for BCG weekly x 3.   2   Rising PSA after prostate cancer treatment - R97.21 Chronic, Stable - I will get a PSA prior to his next visit.      PLAN:           Orders Labs Cytology w/reflex FISH          Schedule Labs: 3 Months - PSA    3 Months - Urinalysis  Return Visit/Planned Activity: 3 Months - Office Visit, Cystoscopy          Document Letter(s):  Created for Patient: Clinical Summary         Notes:   CC: Dr. Georgette Gentry.         Next Appointment:      Next Appointment: 08/16/2024 11:15 AM    Appointment Type: Laboratory Appointment    Location: [facility] [phone]    Provider: Lab LAB    Reason for Visit: 3 Months - PSA     Addendum:  The cytology had atypia with an abnormal FISH so he will have cystoscopy with bilateral RTG's  and a biopsy with fulguration.   Risks reviewed in detail.

## 2024-06-18 ENCOUNTER — Ambulatory Visit (HOSPITAL_COMMUNITY)

## 2024-06-18 ENCOUNTER — Encounter (HOSPITAL_COMMUNITY): Payer: Self-pay | Admitting: Urology

## 2024-06-18 ENCOUNTER — Other Ambulatory Visit: Payer: Self-pay

## 2024-06-18 ENCOUNTER — Encounter (HOSPITAL_COMMUNITY): Admission: RE | Disposition: A | Payer: Self-pay | Source: Home / Self Care | Attending: Urology

## 2024-06-18 ENCOUNTER — Ambulatory Visit (HOSPITAL_COMMUNITY): Admitting: Physician Assistant

## 2024-06-18 ENCOUNTER — Ambulatory Visit (HOSPITAL_COMMUNITY)
Admission: RE | Admit: 2024-06-18 | Discharge: 2024-06-18 | Disposition: A | Discharge status: Home / Self Care | Attending: Urology | Admitting: Urology

## 2024-06-18 ENCOUNTER — Ambulatory Visit (HOSPITAL_COMMUNITY): Admitting: Anesthesiology

## 2024-06-18 DIAGNOSIS — C675 Malignant neoplasm of bladder neck: Secondary | ICD-10-CM | POA: Diagnosis not present

## 2024-06-18 DIAGNOSIS — C671 Malignant neoplasm of dome of bladder: Secondary | ICD-10-CM | POA: Insufficient documentation

## 2024-06-18 DIAGNOSIS — I10 Essential (primary) hypertension: Secondary | ICD-10-CM

## 2024-06-18 DIAGNOSIS — D494 Neoplasm of unspecified behavior of bladder: Secondary | ICD-10-CM | POA: Diagnosis not present

## 2024-06-18 DIAGNOSIS — Z8546 Personal history of malignant neoplasm of prostate: Secondary | ICD-10-CM | POA: Insufficient documentation

## 2024-06-18 DIAGNOSIS — I4510 Unspecified right bundle-branch block: Secondary | ICD-10-CM | POA: Insufficient documentation

## 2024-06-18 DIAGNOSIS — E785 Hyperlipidemia, unspecified: Secondary | ICD-10-CM

## 2024-06-18 DIAGNOSIS — R9721 Rising PSA following treatment for malignant neoplasm of prostate: Secondary | ICD-10-CM | POA: Diagnosis not present

## 2024-06-18 DIAGNOSIS — R310 Gross hematuria: Secondary | ICD-10-CM | POA: Diagnosis present

## 2024-06-18 HISTORY — PX: CYSTOSCOPY W/ RETROGRADES: SHX1426

## 2024-06-18 HISTORY — PX: CYSTOSCOPY WITH FULGERATION: SHX6638

## 2024-06-18 SURGERY — CYSTOSCOPY, WITH BLADDER FULGURATION
Anesthesia: General | Site: Ureter

## 2024-06-18 MED ORDER — CIPROFLOXACIN IN D5W 400 MG/200ML IV SOLN
400.0000 mg | INTRAVENOUS | Status: AC
  Administered 2024-06-18: 400 mg via INTRAVENOUS
  Filled 2024-06-18: qty 200

## 2024-06-18 MED ORDER — ONDANSETRON HCL 4 MG/2ML IJ SOLN: 4.0000 mg | Freq: Once | INTRAMUSCULAR | Status: DC | PRN

## 2024-06-18 MED ORDER — FENTANYL CITRATE (PF) 50 MCG/ML IJ SOSY: 25.0000 ug | PREFILLED_SYRINGE | INTRAMUSCULAR | Status: DC | PRN

## 2024-06-18 MED ORDER — HYDROCODONE-ACETAMINOPHEN 5-325 MG PO TABS: 1.0000 | ORAL_TABLET | Freq: Four times a day (QID) | ORAL | 0 refills | Status: DC | PRN

## 2024-06-18 MED ORDER — STERILE WATER FOR IRRIGATION IR SOLN
Status: DC | PRN
  Administered 2024-06-18: 3000 mL

## 2024-06-18 MED ORDER — DEXAMETHASONE SODIUM PHOSPHATE 4 MG/ML IJ SOLN
INTRAMUSCULAR | Status: DC | PRN
  Administered 2024-06-18: 5 mg via INTRAVENOUS

## 2024-06-18 MED ORDER — OXYCODONE HCL 5 MG PO TABS: 5.0000 mg | ORAL_TABLET | Freq: Once | ORAL | Status: DC | PRN

## 2024-06-18 MED ORDER — SODIUM CHLORIDE 0.9% FLUSH: 3.0000 mL | INTRAVENOUS | Status: DC | PRN

## 2024-06-18 MED ORDER — EPHEDRINE 5 MG/ML INJ
INTRAVENOUS | Status: AC
  Filled 2024-06-18: qty 5

## 2024-06-18 MED ORDER — ONDANSETRON HCL 4 MG/2ML IJ SOLN
INTRAMUSCULAR | Status: DC | PRN
  Administered 2024-06-18: 4 mg via INTRAVENOUS

## 2024-06-18 MED ORDER — SODIUM CHLORIDE 0.9 % IR SOLN
Status: DC | PRN
  Administered 2024-06-18 (×2): 3000 mL

## 2024-06-18 MED ORDER — CHLORHEXIDINE GLUCONATE 0.12 % MT SOLN
15.0000 mL | Freq: Once | OROMUCOSAL | Status: AC
  Administered 2024-06-18: 15 mL via OROMUCOSAL

## 2024-06-18 MED ORDER — SUCCINYLCHOLINE CHLORIDE 200 MG/10ML IV SOSY
PREFILLED_SYRINGE | INTRAVENOUS | Status: AC
  Filled 2024-06-18: qty 10

## 2024-06-18 MED ORDER — LACTATED RINGERS IV SOLN: INTRAVENOUS | Status: DC

## 2024-06-18 MED ORDER — SODIUM CHLORIDE 0.9% FLUSH: 3.0000 mL | Freq: Two times a day (BID) | INTRAVENOUS | Status: DC

## 2024-06-18 MED ORDER — MORPHINE SULFATE (PF) 2 MG/ML IV SOLN: 2.0000 mg | INTRAVENOUS | Status: DC | PRN

## 2024-06-18 MED ORDER — PROPOFOL 10 MG/ML IV BOLUS
INTRAVENOUS | Status: DC | PRN
  Administered 2024-06-18 (×2): 100 mg via INTRAVENOUS
  Administered 2024-06-18: 50 mg via INTRAVENOUS

## 2024-06-18 MED ORDER — IOHEXOL 300 MG/ML  SOLN
INTRAMUSCULAR | Status: DC | PRN
  Administered 2024-06-18: 20 mL

## 2024-06-18 MED ORDER — ACETAMINOPHEN 10 MG/ML IV SOLN: 1000.0000 mg | Freq: Once | INTRAVENOUS | Status: DC | PRN

## 2024-06-18 MED ORDER — ACETAMINOPHEN 325 MG PO TABS: 650.0000 mg | ORAL_TABLET | ORAL | Status: DC | PRN

## 2024-06-18 MED ORDER — ORAL CARE MOUTH RINSE: 15.0000 mL | Freq: Once | OROMUCOSAL | Status: AC

## 2024-06-18 MED ORDER — LIDOCAINE HCL (PF) 2 % IJ SOLN
INTRAMUSCULAR | Status: AC
  Filled 2024-06-18: qty 5

## 2024-06-18 MED ORDER — ACETAMINOPHEN 650 MG RE SUPP: 650.0000 mg | RECTAL | Status: DC | PRN

## 2024-06-18 MED ORDER — FENTANYL CITRATE (PF) 100 MCG/2ML IJ SOLN
INTRAMUSCULAR | Status: DC | PRN
  Administered 2024-06-18 (×2): 50 ug via INTRAVENOUS

## 2024-06-18 MED ORDER — DROPERIDOL 2.5 MG/ML IJ SOLN: 0.6250 mg | Freq: Once | INTRAMUSCULAR | Status: DC | PRN

## 2024-06-18 MED ORDER — PROPOFOL 10 MG/ML IV BOLUS
INTRAVENOUS | Status: AC
  Filled 2024-06-18: qty 20

## 2024-06-18 MED ORDER — ONDANSETRON HCL 4 MG/2ML IJ SOLN
INTRAMUSCULAR | Status: AC
  Filled 2024-06-18: qty 2

## 2024-06-18 MED ORDER — OXYCODONE HCL 5 MG/5ML PO SOLN: 5.0000 mg | Freq: Once | ORAL | Status: DC | PRN

## 2024-06-18 MED ORDER — OXYCODONE HCL 5 MG PO TABS: 5.0000 mg | ORAL_TABLET | ORAL | Status: DC | PRN

## 2024-06-18 MED ORDER — SUCCINYLCHOLINE CHLORIDE 200 MG/10ML IV SOSY
PREFILLED_SYRINGE | INTRAVENOUS | Status: DC | PRN
  Administered 2024-06-18: 100 mg via INTRAVENOUS

## 2024-06-18 MED ORDER — EPHEDRINE SULFATE (PRESSORS) 50 MG/ML IJ SOLN
INTRAMUSCULAR | Status: DC | PRN
  Administered 2024-06-18: 10 mg via INTRAVENOUS

## 2024-06-18 MED ORDER — SODIUM CHLORIDE 0.9 % IV SOLN: 250.0000 mL | INTRAVENOUS | Status: DC | PRN

## 2024-06-18 MED ORDER — FENTANYL CITRATE (PF) 100 MCG/2ML IJ SOLN
INTRAMUSCULAR | Status: AC
  Filled 2024-06-18: qty 2

## 2024-06-18 SURGICAL SUPPLY — 21 items
BAG URINE DRAIN 2000ML AR STRL (UROLOGICAL SUPPLIES) IMPLANT
BAG URO CATCHER STRL LF (MISCELLANEOUS) ×3 IMPLANT
CATH FOLEY 2WAY SLVR 5CC 18FR (CATHETERS) IMPLANT
CATH FOLEY 3WAY 30CC 22FR (CATHETERS) IMPLANT
CATH URETL OPEN 5X70 (CATHETERS) IMPLANT
CLOTH BEACON ORANGE TIMEOUT ST (SAFETY) ×3 IMPLANT
DRAPE FOOT SWITCH (DRAPES) ×3 IMPLANT
ELECT REM PT RETURN 15FT ADLT (MISCELLANEOUS) ×3 IMPLANT
GLOVE SURG SS PI 8.0 STRL IVOR (GLOVE) ×3 IMPLANT
GOWN STRL SURGICAL XL XLNG (GOWN DISPOSABLE) ×3 IMPLANT
GUIDEWIRE STR DUAL SENSOR (WIRE) ×3 IMPLANT
HOLDER FOLEY CATH W/STRAP (MISCELLANEOUS) IMPLANT
KIT TURNOVER KIT A (KITS) ×3 IMPLANT
LOOP CUT BIPOLAR 24F LRG (ELECTROSURGICAL) IMPLANT
MANIFOLD NEPTUNE II (INSTRUMENTS) ×3 IMPLANT
PACK CYSTO (CUSTOM PROCEDURE TRAY) ×3 IMPLANT
SUT ETHILON 3 0 PS 1 (SUTURE) IMPLANT
SYR 30ML LL (SYRINGE) IMPLANT
SYRINGE TOOMEY IRRIG 70ML (MISCELLANEOUS) IMPLANT
TUBING CONNECTING 10 (TUBING) ×3 IMPLANT
TUBING UROLOGY SET (TUBING) ×3 IMPLANT

## 2024-06-18 NOTE — Anesthesia Preprocedure Evaluation (Addendum)
 Anesthesia Evaluation  Patient identified by MRN, date of birth, ID band Patient awake    Reviewed: Allergy & Precautions, NPO status , Patient's Chart, lab work & pertinent test results  History of Anesthesia Complications Negative for: history of anesthetic complications  Airway Mallampati: II  TM Distance: >3 FB Neck ROM: Full    Dental no notable dental hx. (+) Dental Advisory Given   Pulmonary neg sleep apnea, neg COPD   breath sounds clear to auscultation       Cardiovascular hypertension, (-) CAD and (-) Past MI + dysrhythmias (RBBB)  Rhythm:Regular Rate:Normal  TTE (2022):  1. Left ventricular ejection fraction, by estimation, is 55 to 60%. The  left ventricle has normal function. The left ventricle has no regional  wall motion abnormalities. There is moderate left ventricular hypertrophy.  Left ventricular diastolic  parameters are consistent with Grade I diastolic dysfunction (impaired  relaxation).   2. Right ventricular systolic function is normal. The right ventricular  size is normal.   3. The mitral valve is normal in structure. Trivial mitral valve  regurgitation. No evidence of mitral stenosis.   4. The aortic valve is tricuspid. Aortic valve regurgitation is not  visualized. Mild aortic valve sclerosis is present, with no evidence of  aortic valve stenosis.   5. The inferior vena cava is normal in size with greater than 50%  respiratory variability, suggesting right atrial pressure of 3 mmHg.     Neuro/Psych    GI/Hepatic ,GERD  ,,  Endo/Other    Renal/GU      Musculoskeletal  (+) Arthritis ,    Abdominal   Peds  Hematology   Anesthesia Other Findings   Reproductive/Obstetrics                              Anesthesia Physical Anesthesia Plan  ASA: 3  Anesthesia Plan: General   Post-op Pain Management:    Induction:   PONV Risk Score and Plan: 1 and  Propofol  infusion  Airway Management Planned: LMA  Additional Equipment:   Intra-op Plan:   Post-operative Plan: Extubation in OR  Informed Consent:      Dental advisory given  Plan Discussed with: CRNA and Surgeon  Anesthesia Plan Comments:          Anesthesia Quick Evaluation

## 2024-06-18 NOTE — Interval H&P Note (Signed)
 History and Physical Interval Note: No change  06/18/2024 11:46 AM  Eric Dominguez  has presented today for surgery, with the diagnosis of HISTORY OF BALDDER CANCER.  The various methods of treatment have been discussed with the patient and family. After consideration of risks, benefits and other options for treatment, the patient has consented to  Procedure(s): CYSTOSCOPY, WITH BLADDER FULGURATION (N/A) CYSTOSCOPY, WITH RETROGRADE PYELOGRAM (Bilateral) as a surgical intervention.  The patient's history has been reviewed, patient examined, no change in status, stable for surgery.  I have reviewed the patient's chart and labs.  Questions were answered to the patient's satisfaction.     Nafisah Runions

## 2024-06-18 NOTE — Transfer of Care (Signed)
 Immediate Anesthesia Transfer of Care Note  Patient: Eric Dominguez  Procedure(s) Performed: CYSTOSCOPY, WITH TURBT (Bladder) CYSTOSCOPY, WITH RETROGRADE PYELOGRAM (Bilateral: Ureter)  Patient Location: PACU  Anesthesia Type:General  Level of Consciousness: awake and alert   Airway & Oxygen Therapy: Patient Spontanous Breathing and Patient connected to face mask oxygen  Post-op Assessment: Report given to RN and Post -op Vital signs reviewed and stable  Post vital signs: Reviewed and stable  Last Vitals:  Vitals Value Taken Time  BP    Temp    Pulse 55 06/18/24 12:59  Resp 17 06/18/24 12:59  SpO2 98 % 06/18/24 12:59  Vitals shown include unfiled device data.  Last Pain:  Vitals:   06/18/24 1109  TempSrc: Oral  PainSc: 0-No pain         Complications: No notable events documented.

## 2024-06-18 NOTE — Anesthesia Postprocedure Evaluation (Signed)
 Anesthesia Post Note  Patient: Eric Dominguez  Procedure(s) Performed: CYSTOSCOPY, WITH TURBT (Bladder) CYSTOSCOPY, WITH RETROGRADE PYELOGRAM (Bilateral: Ureter)     Patient location during evaluation: PACU Anesthesia Type: General Level of consciousness: awake Pain management: pain level controlled Vital Signs Assessment: post-procedure vital signs reviewed and stable Respiratory status: spontaneous breathing Cardiovascular status: blood pressure returned to baseline Postop Assessment: no apparent nausea or vomiting Anesthetic complications: no   No notable events documented.  Last Vitals:  Vitals:   06/18/24 1345 06/18/24 1400  BP: (!) 165/80 (!) 158/80  Pulse: (!) 50 (!) 51  Resp:  18  Temp:    SpO2: 98% 97%    Last Pain:  Vitals:   06/18/24 1400  TempSrc:   PainSc: 0-No pain                 Lauraine KATHEE [place]

## 2024-06-18 NOTE — Anesthesia Procedure Notes (Signed)
 Procedure Name: Intubation Date/Time: 06/18/2024 12:05 PM  Performed by: Pam Macario BROCKS, CRNAPre-anesthesia Checklist: Patient identified, Emergency Drugs available, Suction available, Patient being monitored and Timeout performed Patient Re-evaluated:Patient Re-evaluated prior to induction Oxygen Delivery Method: Circle system utilized Preoxygenation: Pre-oxygenation with 100% oxygen Induction Type: IV induction Ventilation: Mask ventilation without difficulty Laryngoscope Size: Mac and 4 Grade View: Grade II Tube type: Oral Tube size: 7.5 mm Number of attempts: 1 Airway Equipment and Method: Stylet and Oral airway Placement Confirmation: ETT inserted through vocal cords under direct vision, positive ETCO2, breath sounds checked- equal and bilateral and CO2 detector Secured at: 24 cm Tube secured with: Tape Dental Injury: Teeth and Oropharynx as per pre-operative assessment  Comments: Attempted LMA # 5 placement - repositioned. Unable to seat well. Elective choice by Dr Waddell to intubate. Pt stable mask ventilates well BBS

## 2024-06-18 NOTE — Op Note (Signed)
 Procedure: 1.  Cystoscopy with bilateral retrograde pyelography and interpretation. 2.  Application of fluoroscopy. 3.  Transurethral resection of bladder tumor.  Pre-op diagnosis: History of bladder tumor with recurrent bladder lesions and atypical cytology following induction BCG treatment.  Postop diagnosis: Same.  Surgeon: Dr. Norleen Seltzer.  Anesthesia: General.  Specimen: 1.  Cup biopsy from erythematous lesion on the dome. 2.  Cup biopsy from erythematous lesion at the right bladder neck and trigone. 3.  Transurethral resection biopsy from the left posterior bladder neck.  Drains: 18 French Foley catheter.  EBL: None.  Complications: None.  Indications:The patient is a 78 year old male who has a history of a T1 high-grade papillary urothelial carcinoma it was initially resected on 09/26/2023.  He had residual disease on restaging resection on 10/31/2023.  He completed induction BCG on 01/06/2024 and on recent cystoscopy was found to have erythematous areas with comedo changes on the dome and right bladder neck and a subsequent cytology showed atypia with an abnormal FISH so it was felt that biopsy was indicated along with upper tract studies.  Procedure: He was taken the operating room where a general anesthetic was induced.  He was given Cipro  400 mg IV.  He was placed in lithotomy position and food PSIs.  His perineum and genitalia were prepped with Betadine  solution and draped in usual sterile fashion.  Cystoscopy was performed using the 21 [place] scope and 30 degree lens.  Examination revealed a normal urethra.  The external sphincter was slightly incompetent with radiation changes.  The prostatic urethra was short with evidence of prior resection and blanching from radiation therapy.  Examination of the bladder demonstrated approximately 3 cm erythematous lesion on the dome without any papillary foci but 1 area in the center was slightly thicker than the surrounding tissue.  There was  a similarly sized erythematous area on the right bladder neck extending to the right trigone.  And there was slight nodularity of the left bladder neck.  There was a resection scar just lateral to the left ureteral orifice the left ureteral orifice was unremarkable.  The right ureteral orifice had some surrounding erythematous mucosa but was otherwise unremarkable.  After completion of the cystoscopic inspection, bilateral retrograde pyelograms were performed using a 5 [place] open-ended catheter and Omnipaque .  The left retrograde pyelogram demonstrated a normal ureter and intrarenal collecting system without filling defects.  There were a few air bubbles in the system but these moved consistent with air bubbles and dissipated with drainage.  The right retrograde pyelogram demonstrated a normal ureter and intrarenal collecting system without filling defects.  After completion of the retrograde pyelograms a cup biopsy forceps was used to obtain biopsies from the lesion on the dome and the right lateral wall.  These were sent as separate specimens.  The cystoscope was removed and was replaced with the 26 French continuous-flow resectoscope sheath which was fitted with a Iglesias handle, bipolar loop and the 30 degree lens.  Saline was used as the irrigant.  The biopsy sites were then generously fulgurated with the TUR loop.  The biopsy site at the bladder neck on the right demonstrated a very attenuated bladder wall but no clear perforation.  This area was fulgurated for a diameter approximately 3 cm to destroy the abnormal mucosa, but care was taken to avoid the ureteral orifice.  The biopsy site on the dome was then also generously fulgurated along with the surrounding mucosa for a diameter approximately 3 cm once again to destroy  all of the abnormal mucosa.  The lesion at the left posterior bladder neck was then resected.  It was approximately 1 cm in diameter.  This was sent as a separate specimen.   Final hemostasis was achieved and final inspection demonstrated no active bleeding or obvious bladder perforation.  Resectoscope was removed and an 53 French Foley catheter was inserted.  The balloon was filled with 10 mL of sterile fluid and the catheter was placed to straight drainage.  He was taken down from lithotomy position, his anesthetic was reversed and he was moved to recovery in stable condition.  There were no complications.

## 2024-06-18 NOTE — Discharge Instructions (Addendum)
You may remove the catheter in the morning.

## 2024-06-19 ENCOUNTER — Encounter (HOSPITAL_COMMUNITY): Payer: Self-pay | Admitting: Urology

## 2024-06-22 LAB — SURGICAL PATHOLOGY

## 2024-07-05 NOTE — Telephone Encounter (Signed)
 Copied from CRM #[id number]. Topic: New Scheduling - Other New Intake >> Jul 05, 2024 12:14 PM Davied BROCKS wrote: Eric Dominguez, Eric Dominguez 1946/08/30 [phone]  We just received a call regarding this patient. They are requesting a new appointment for a Genitourinary/GU  Malignant neoplasm of urinary bladder, unspecified site diagnosis. Please follow up with the patient for scheduling.    Referral: Yes  Thank you,  Davied ONEIDA Filter [facility] [phone]

## 2024-07-08 ENCOUNTER — Other Ambulatory Visit: Payer: Self-pay | Admitting: Urology

## 2024-07-12 NOTE — Progress Notes (Signed)
 PCP - Elsie Gentry, MD Cardiologist - Vina Gull, MD LOV 10-14 24 epic  PPM/ICD -  Device Orders -  Rep Notified -   Chest x-ray -  EKG - PREOP Stress Test -  ECHO - 2022 epic Cardiac Cath -  CT cardiac score- 06-04-21 epic  Sleep Study - n/a CPAP - n/a  Fasting Blood Sugar - n/a Checks Blood Sugar __n/a___ times a day  Blood Thinner Instructions:n/a Aspirin  Instructions:n/a  ERAS Protcol -n/a PRE-SURGERY n/a   COVID vaccine -  Activity--Able to climb a flight of stairs with no CP or SOB Anesthesia review: CAD, HTN, RBBB and first degree block  Patient denies shortness of breath, fever, cough and chest pain at PAT appointment   All instructions explained to the patient, with a verbal understanding of the material. Patient agrees to go over the instructions while at home for a better understanding. Patient also instructed to self quarantine after being tested for COVID-19. The opportunity to ask questions was provided.

## 2024-07-12 NOTE — Patient Instructions (Signed)
 SURGICAL WAITING ROOM VISITATION  Patients having surgery or a procedure may have no more than 2 support people in the waiting area - these visitors may rotate.    Children under the age of 61 must have an adult with them who is not the patient.  Visitors with respiratory illnesses are discouraged from visiting and should remain at home.  If the patient needs to stay at the hospital during part of their recovery, the visitor guidelines for inpatient rooms apply. Pre-op nurse will coordinate an appropriate time for 1 support person to accompany patient in pre-op.  This support person may not rotate.    Please refer to the [facility] website for the visitor guidelines for Inpatients (after your surgery is over and you are in a regular room).       Your procedure is scheduled on: 07-20-24   Report to [facility] Main Entrance    Report to admitting at      0515  AM   Call this number if you have problems the morning of surgery [phone]   Do not eat food  or drink liquids :After Midnight.               If you have questions, please contact your surgeon's office.   FOLLOW  ANY ADDITIONAL PRE OP INSTRUCTIONS YOU RECEIVED FROM YOUR SURGEON'S OFFICE!!!     Oral Hygiene is also important to reduce your risk of infection.                                    Remember - BRUSH YOUR TEETH THE MORNING OF SURGERY WITH YOUR REGULAR TOOTHPASTE  DENTURES WILL BE REMOVED PRIOR TO SURGERY PLEASE DO NOT APPLY Poly grip OR ADHESIVES!!!   Do NOT smoke after Midnight   Stop all vitamins and herbal supplements 7 days before surgery.   Take these medicines the morning of surgery with A SIP OF WATER : Zyrtec if needed, Zetia, hydrocodone  if needed  DO NOT TAKE ANY ORAL DIABETIC MEDICATIONS DAY OF YOUR SURGERY  Bring CPAP mask and tubing day of surgery.                              You may not have any metal on your body including hair pins, jewelry, and body piercing              Do not wear  lotions, powders, perfumes/cologne, or deodorant                Men may shave face and neck.   Do not bring valuables to the hospital. [facility] IS NOT             RESPONSIBLE   FOR VALUABLES.   Contacts, glasses, dentures or bridgework may not be worn into surgery.   Bring small overnight bag day of surgery.   DO NOT BRING YOUR HOME MEDICATIONS TO THE HOSPITAL. PHARMACY WILL DISPENSE MEDICATIONS LISTED ON YOUR MEDICATION LIST TO YOU DURING YOUR ADMISSION IN THE HOSPITAL!    Patients discharged on the day of surgery will not be allowed to drive home.  Someone NEEDS to stay with you for the first 24 hours after anesthesia.   Special Instructions: Bring a copy of your healthcare power of attorney and living will documents the day of surgery if you haven't scanned them before.  Please read over the following fact sheets you were given: IF YOU HAVE QUESTIONS ABOUT YOUR PRE-OP INSTRUCTIONS PLEASE CALL [phone].   If you received a COVID test during your pre-op visit  it is requested that you wear a mask when out in public, stay away from anyone that may not be feeling well and notify your surgeon if you develop symptoms. If you test positive for Covid or have been in contact with anyone that has tested positive in the last 10 days please notify you surgeon.    [place] - Preparing for Surgery Before surgery, you can play an important role.  Because skin is not sterile, your skin needs to be as free of germs as possible.  You can reduce the number of germs on your skin by washing with CHG (chlorahexidine gluconate) soap before surgery.  CHG is an antiseptic cleaner which kills germs and bonds with the skin to continue killing germs even after washing. Please DO NOT use if you have an allergy to CHG or antibacterial soaps.  If your skin becomes reddened/irritated stop using the CHG and inform your nurse when you arrive at Short Stay. Do not shave (including legs and  underarms) for at least 48 hours prior to the first CHG shower.  You may shave your face/neck.  Please follow these instructions carefully:  1.  Shower with CHG Soap the night before surgery and the morning of surgery.  2.  If you choose to wash your hair, wash your hair first as usual with your normal  shampoo.  3.  After you shampoo, rinse your hair and body thoroughly to remove the shampoo.                             4.  Use CHG as you would any other liquid soap.  You can apply chg directly to the skin and wash.  Gently with a scrungie or clean washcloth.  5.  Apply the CHG Soap to your body ONLY FROM THE NECK DOWN.   Do not use on face/ open                           Wound or open sores. Avoid contact with eyes, ears mouth and genitals (private parts).                       Wash face,  Genitals (private parts) with your normal soap.             6.  Wash thoroughly, paying special attention to the area where your  surgery  will be performed.  7.  Thoroughly rinse your body with warm water  from the neck down.  8.  DO NOT shower/wash with your normal soap after using and rinsing off the CHG Soap.                9.  Pat yourself dry with a clean towel.            10.  Wear clean pajamas.            11.  Place clean sheets on your bed the night of your first shower and do not  sleep with pets. Day of Surgery : Do not apply any CHG, lotions/deodorants the morning of surgery.  Please wear clean clothes to the hospital/surgery center.  FAILURE TO FOLLOW THESE INSTRUCTIONS MAY  RESULT IN THE CANCELLATION OF YOUR SURGERY  PATIENT SIGNATURE_________________________________  NURSE SIGNATURE__________________________________  ________________________________________________________________________

## 2024-07-16 ENCOUNTER — Encounter (HOSPITAL_COMMUNITY): Payer: Self-pay

## 2024-07-16 ENCOUNTER — Other Ambulatory Visit: Payer: Self-pay

## 2024-07-16 ENCOUNTER — Encounter (HOSPITAL_COMMUNITY)
Admission: RE | Admit: 2024-07-16 | Discharge: 2024-07-16 | Disposition: A | Discharge status: Home / Self Care | Source: Ambulatory Visit | Attending: Urology | Admitting: Urology

## 2024-07-16 VITALS — BP 114/66 | HR 53 | Temp 98.1°F | Resp 16 | Ht 72.0 in | Wt 208.0 lb

## 2024-07-16 DIAGNOSIS — Z01818 Encounter for other preprocedural examination: Secondary | ICD-10-CM | POA: Diagnosis present

## 2024-07-16 DIAGNOSIS — I10 Essential (primary) hypertension: Secondary | ICD-10-CM | POA: Insufficient documentation

## 2024-07-16 HISTORY — DX: Unspecified osteoarthritis, unspecified site: M19.90

## 2024-07-16 LAB — BASIC METABOLIC PANEL WITH GFR
Anion gap: 11 (ref 5–15)
BUN: 37 mg/dL — ABNORMAL HIGH (ref 8–23)
CO2: 21 mmol/L — ABNORMAL LOW (ref 22–32)
Calcium: 8.8 mg/dL — ABNORMAL LOW (ref 8.9–10.3)
Chloride: 103 mmol/L (ref 98–111)
Creatinine, Ser: 0.94 mg/dL (ref 0.61–1.24)
GFR, Estimated: >60 mL/min (ref >60)
Glucose, Bld: 103 mg/dL — ABNORMAL HIGH (ref 70–99)
Potassium: 5.2 mmol/L — ABNORMAL HIGH (ref 3.5–5.1)
Sodium: 134 mmol/L — ABNORMAL LOW (ref 135–145)

## 2024-07-16 LAB — CBC
HCT: 40.2 % (ref 39.0–52.0)
Hemoglobin: 13 g/dL (ref 13.0–17.0)
MCH: 31.6 pg (ref 26.0–34.0)
MCHC: 32.3 g/dL (ref 30.0–36.0)
MCV: 97.8 fL (ref 80.0–100.0)
Platelets: 195 K/uL (ref 150–400)
RBC: 4.11 MIL/uL — ABNORMAL LOW (ref 4.22–5.81)
RDW: 14 % (ref 11.5–15.5)
WBC: 7 K/uL (ref 4.0–10.5)
nRBC: 0 % (ref 0.0–0.2)

## 2024-07-16 NOTE — Progress Notes (Signed)
 Phamacy call center # given. No response from Pharmacy Tech. At preop

## 2024-07-19 ENCOUNTER — Encounter (HOSPITAL_COMMUNITY): Payer: Self-pay | Admitting: Urology

## 2024-07-19 NOTE — Anesthesia Preprocedure Evaluation (Signed)
 Anesthesia Evaluation  Patient identified by MRN, date of birth, ID band Patient awake    Reviewed: Allergy & Precautions, NPO status , Patient's Chart, lab work & pertinent test results  Airway Mallampati: II  TM Distance: >3 FB Neck ROM: Full    Dental  (+) Teeth Intact, Dental Advisory Given   Pulmonary neg pulmonary ROS   breath sounds clear to auscultation       Cardiovascular hypertension, Pt. on medications  Rhythm:Regular Rate:Normal  EKG 07/16/24: SB, 1st Deg AVB, RBBB  Echo 12/01/2020: Normal LV function with moderate LVH. Normal RV systolic function. Trace MR.   Neuro/Psych negative neurological ROS  negative psych ROS   GI/Hepatic ,GERD  Medicated and Controlled,,(+) Hepatitis -  Endo/Other  HLD  Renal/GU    Bladder Cancer; Hx of Prostate Cancer s/p XRT Seeds (2014)     Musculoskeletal  (+) Arthritis , Osteoarthritis,    Abdominal   Peds  Hematology negative hematology ROS (+)   Anesthesia Other Findings   Reproductive/Obstetrics                              Anesthesia Physical Anesthesia Plan  ASA: 3  Anesthesia Plan: General   Post-op Pain Management: Minimal or no pain anticipated   Induction: Intravenous  PONV Risk Score and Plan: 4 or greater and Treatment may vary due to age or medical condition  Airway Management Planned: Oral ETT  Additional Equipment: None  Intra-op Plan:   Post-operative Plan: Extubation in OR  Informed Consent:      Dental advisory given  Plan Discussed with: CRNA  Anesthesia Plan Comments:          Anesthesia Quick Evaluation

## 2024-07-20 ENCOUNTER — Encounter (HOSPITAL_COMMUNITY): Payer: Self-pay | Admitting: Urology

## 2024-07-20 ENCOUNTER — Ambulatory Visit (HOSPITAL_COMMUNITY): Admitting: Anesthesiology

## 2024-07-20 ENCOUNTER — Ambulatory Visit (HOSPITAL_COMMUNITY): Payer: Self-pay | Admitting: Physician Assistant

## 2024-07-20 ENCOUNTER — Encounter (HOSPITAL_COMMUNITY)
Admission: RE | Disposition: A | Discharge status: Home / Self Care | Payer: Self-pay | Source: Home / Self Care | Attending: Urology

## 2024-07-20 ENCOUNTER — Ambulatory Visit (HOSPITAL_COMMUNITY)
Admission: RE | Admit: 2024-07-20 | Discharge: 2024-07-20 | Disposition: A | Discharge status: Home / Self Care | Attending: Urology | Admitting: Urology

## 2024-07-20 DIAGNOSIS — E785 Hyperlipidemia, unspecified: Secondary | ICD-10-CM

## 2024-07-20 DIAGNOSIS — Z79899 Other long term (current) drug therapy: Secondary | ICD-10-CM | POA: Diagnosis not present

## 2024-07-20 DIAGNOSIS — Z8619 Personal history of other infectious and parasitic diseases: Secondary | ICD-10-CM | POA: Insufficient documentation

## 2024-07-20 DIAGNOSIS — C678 Malignant neoplasm of overlapping sites of bladder: Secondary | ICD-10-CM | POA: Diagnosis present

## 2024-07-20 DIAGNOSIS — K219 Gastro-esophageal reflux disease without esophagitis: Secondary | ICD-10-CM | POA: Insufficient documentation

## 2024-07-20 DIAGNOSIS — C679 Malignant neoplasm of bladder, unspecified: Secondary | ICD-10-CM

## 2024-07-20 DIAGNOSIS — C671 Malignant neoplasm of dome of bladder: Secondary | ICD-10-CM

## 2024-07-20 DIAGNOSIS — I10 Essential (primary) hypertension: Secondary | ICD-10-CM

## 2024-07-20 DIAGNOSIS — Z8546 Personal history of malignant neoplasm of prostate: Secondary | ICD-10-CM | POA: Diagnosis not present

## 2024-07-20 HISTORY — PX: TRANSURETHRAL RESECTION OF BLADDER TUMOR: SHX2575

## 2024-07-20 LAB — POTASSIUM: Potassium: 4.7 mmol/L (ref 3.5–5.1)

## 2024-07-20 SURGERY — TURBT (TRANSURETHRAL RESECTION OF BLADDER TUMOR)
Anesthesia: General

## 2024-07-20 MED ORDER — LIDOCAINE HCL (PF) 2 % IJ SOLN
INTRAMUSCULAR | Status: AC
  Filled 2024-07-20: qty 5

## 2024-07-20 MED ORDER — SODIUM CHLORIDE 0.9% FLUSH: 3.0000 mL | Freq: Two times a day (BID) | INTRAVENOUS | Status: DC

## 2024-07-20 MED ORDER — SODIUM CHLORIDE 0.9 % IR SOLN
Status: DC | PRN
  Administered 2024-07-20: 6000 mL

## 2024-07-20 MED ORDER — ONDANSETRON HCL 4 MG/2ML IJ SOLN
INTRAMUSCULAR | Status: AC
  Filled 2024-07-20: qty 2

## 2024-07-20 MED ORDER — FENTANYL CITRATE (PF) 50 MCG/ML IJ SOSY
25.0000 ug | PREFILLED_SYRINGE | INTRAMUSCULAR | Status: DC | PRN
  Administered 2024-07-20: 50 ug via INTRAVENOUS

## 2024-07-20 MED ORDER — SODIUM CHLORIDE 0.9 % IV SOLN: 250.0000 mL | INTRAVENOUS | Status: DC | PRN

## 2024-07-20 MED ORDER — SUGAMMADEX SODIUM 200 MG/2ML IV SOLN
INTRAVENOUS | Status: DC | PRN
  Administered 2024-07-20: 200 mg via INTRAVENOUS

## 2024-07-20 MED ORDER — OXYCODONE HCL 5 MG/5ML PO SOLN: 5.0000 mg | Freq: Once | ORAL | Status: DC | PRN

## 2024-07-20 MED ORDER — CIPROFLOXACIN IN D5W 400 MG/200ML IV SOLN
400.0000 mg | INTRAVENOUS | Status: AC
  Administered 2024-07-20: 400 mg via INTRAVENOUS
  Filled 2024-07-20: qty 200

## 2024-07-20 MED ORDER — PROPOFOL 10 MG/ML IV BOLUS
INTRAVENOUS | Status: DC | PRN
  Administered 2024-07-20: 30 mg via INTRAVENOUS
  Administered 2024-07-20 (×2): 20 mg via INTRAVENOUS
  Administered 2024-07-20: 100 mg via INTRAVENOUS
  Administered 2024-07-20: 30 mg via INTRAVENOUS

## 2024-07-20 MED ORDER — ACETAMINOPHEN 10 MG/ML IV SOLN
1000.0000 mg | Freq: Once | INTRAVENOUS | Status: DC | PRN
Start: 2024-07-20 — End: 2024-07-20

## 2024-07-20 MED ORDER — MORPHINE SULFATE (PF) 2 MG/ML IV SOLN: 2.0000 mg | INTRAVENOUS | Status: DC | PRN

## 2024-07-20 MED ORDER — FENTANYL CITRATE (PF) 100 MCG/2ML IJ SOLN
INTRAMUSCULAR | Status: AC
  Filled 2024-07-20: qty 2

## 2024-07-20 MED ORDER — ACETAMINOPHEN 650 MG RE SUPP: 650.0000 mg | RECTAL | Status: DC | PRN

## 2024-07-20 MED ORDER — CHLORHEXIDINE GLUCONATE 0.12 % MT SOLN
15.0000 mL | Freq: Once | OROMUCOSAL | Status: AC
  Administered 2024-07-20: 15 mL via OROMUCOSAL

## 2024-07-20 MED ORDER — OXYCODONE HCL 5 MG PO TABS: 5.0000 mg | ORAL_TABLET | Freq: Once | ORAL | Status: DC | PRN

## 2024-07-20 MED ORDER — AMISULPRIDE (ANTIEMETIC) 5 MG/2ML IV SOLN: 10.0000 mg | Freq: Once | INTRAVENOUS | Status: DC | PRN

## 2024-07-20 MED ORDER — FENTANYL CITRATE (PF) 100 MCG/2ML IJ SOLN
INTRAMUSCULAR | Status: DC | PRN
  Administered 2024-07-20 (×2): 50 ug via INTRAVENOUS

## 2024-07-20 MED ORDER — OXYCODONE HCL 5 MG PO TABS: 5.0000 mg | ORAL_TABLET | ORAL | Status: DC | PRN

## 2024-07-20 MED ORDER — ONDANSETRON HCL 4 MG/2ML IJ SOLN
INTRAMUSCULAR | Status: DC | PRN
  Administered 2024-07-20: 4 mg via INTRAVENOUS

## 2024-07-20 MED ORDER — PROPOFOL 10 MG/ML IV BOLUS
INTRAVENOUS | Status: AC
  Filled 2024-07-20: qty 20

## 2024-07-20 MED ORDER — ONDANSETRON HCL 4 MG/2ML IJ SOLN: 4.0000 mg | Freq: Once | INTRAMUSCULAR | Status: DC | PRN

## 2024-07-20 MED ORDER — SUGAMMADEX SODIUM 200 MG/2ML IV SOLN
INTRAVENOUS | Status: AC
  Filled 2024-07-20: qty 4

## 2024-07-20 MED ORDER — DEXAMETHASONE SOD PHOSPHATE PF 10 MG/ML IJ SOLN
INTRAMUSCULAR | Status: DC | PRN
  Administered 2024-07-20: 10 mg via INTRAVENOUS

## 2024-07-20 MED ORDER — SODIUM CHLORIDE 0.9% FLUSH: 3.0000 mL | INTRAVENOUS | Status: DC | PRN

## 2024-07-20 MED ORDER — ORAL CARE MOUTH RINSE: 15.0000 mL | Freq: Once | OROMUCOSAL | Status: AC

## 2024-07-20 MED ORDER — ACETAMINOPHEN 325 MG PO TABS: 650.0000 mg | ORAL_TABLET | ORAL | Status: DC | PRN

## 2024-07-20 MED ORDER — PHENYLEPHRINE HCL (PRESSORS) 10 MG/ML IV SOLN
INTRAVENOUS | Status: DC | PRN
  Administered 2024-07-20: 40 ug via INTRAVENOUS
  Administered 2024-07-20: 80 ug via INTRAVENOUS

## 2024-07-20 MED ORDER — ROCURONIUM BROMIDE 100 MG/10ML IV SOLN
INTRAVENOUS | Status: DC | PRN
  Administered 2024-07-20: 60 mg via INTRAVENOUS

## 2024-07-20 MED ORDER — FENTANYL CITRATE (PF) 50 MCG/ML IJ SOSY
PREFILLED_SYRINGE | INTRAMUSCULAR | Status: AC
  Filled 2024-07-20: qty 1

## 2024-07-20 MED ORDER — ROCURONIUM BROMIDE 10 MG/ML (PF) SYRINGE
PREFILLED_SYRINGE | INTRAVENOUS | Status: AC
  Filled 2024-07-20: qty 10

## 2024-07-20 MED ORDER — LACTATED RINGERS IV SOLN: INTRAVENOUS | Status: DC

## 2024-07-20 SURGICAL SUPPLY — 18 items
BAG URINE DRAIN 2000ML AR STRL (UROLOGICAL SUPPLIES) IMPLANT
BAG URO CATCHER STRL LF (MISCELLANEOUS) ×2 IMPLANT
CATH FOLEY 3WAY 30CC 22FR (CATHETERS) IMPLANT
CATH URETL OPEN 5X70 (CATHETERS) IMPLANT
DRAPE FOOT SWITCH (DRAPES) ×2 IMPLANT
ELECT REM PT RETURN 15FT ADLT (MISCELLANEOUS) ×2 IMPLANT
GLOVE SURG SS PI 8.0 STRL IVOR (GLOVE) ×2 IMPLANT
GOWN STRL SURGICAL XL XLNG (GOWN DISPOSABLE) ×2 IMPLANT
HOLDER FOLEY CATH W/STRAP (MISCELLANEOUS) IMPLANT
KIT TURNOVER KIT A (KITS) ×2 IMPLANT
LOOP CUT BIPOLAR 24F LRG (ELECTROSURGICAL) IMPLANT
MANIFOLD NEPTUNE II (INSTRUMENTS) ×2 IMPLANT
PACK CYSTO (CUSTOM PROCEDURE TRAY) ×2 IMPLANT
SUT ETHILON 3 0 PS 1 (SUTURE) IMPLANT
SYR 30ML LL (SYRINGE) IMPLANT
SYRINGE TOOMEY IRRIG 70ML (MISCELLANEOUS) IMPLANT
TUBING CONNECTING 10 (TUBING) ×2 IMPLANT
TUBING UROLOGY SET (TUBING) ×2 IMPLANT

## 2024-07-20 NOTE — Anesthesia Postprocedure Evaluation (Signed)
 Anesthesia Post Note  Patient: Eric Dominguez  Procedure(s) Performed: TURBT (TRANSURETHRAL RESECTION OF BLADDER TUMOR)     Patient location during evaluation: PACU Anesthesia Type: General Level of consciousness: awake Pain management: pain level controlled Vital Signs Assessment: post-procedure vital signs reviewed and stable Respiratory status: spontaneous breathing and nonlabored ventilation Cardiovascular status: blood pressure returned to baseline Postop Assessment: no apparent nausea or vomiting Anesthetic complications: no   No notable events documented.  Last Vitals:  Vitals:   07/20/24 1015 07/20/24 1025  BP: (!) 143/75 132/70  Pulse: (!) 49 (!) 50  Resp: 10 14  Temp: (!) 36 C   SpO2: 95% 98%    Last Pain:  Vitals:   07/20/24 1025  TempSrc:   PainSc: 2                  Lauraine KATHEE [place]

## 2024-07-20 NOTE — H&P (Signed)
 Subjective: 07/20/24: Eric Dominguez returns today for a restaging TURBT for T1 HG NMIBC found on TURBT on 06/18/24.   He has no new complaints.    78 year old man who underwent brachii seed therapy in 2014 presents today with concerns of gross hematuria that started last night. This is associated with frequency, urgency, and mild dysuria. Throughout the course of the evening he developed inability to void. He was urgently worked into my schedule this morning. He is not on any blood thinners. His PVR is 547 mL. He denies fevers, chills, history of kidney stones. He has never had this issue before.    Interval: Eric Dominguez presents today with concerns that his catheter has not been draining.    09/22/23: Eric Dominguez returns today in f/u. The bladder has stopped with the recent irrigation. he is for cystosocpy today. His urine culture was negative.    10/10/2023: Patient returns today after undergoing TURBT on 09/26/2023 with Dr. Watt. 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 He is scheduled to undergo restaging TURBT with gemcitabine  on 10/31/2023. He denies changes to his medical history. Denies gross hematuria, dysuria, flank pain, fever/chills, nausea/vomiting, shortness of breath, chest pains. UA with microscopic abnormalities. PVR 74 cc.    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    02/16/24: Eric Dominguez returns today in f/u for cystoscopy for his history of T1 HG NMIBC treated with a TURBT, restaging and induction BCG. His UA is unremarkable today. His PSA is down slightly to 0.21. He is doing well 6 weeks out from his last induction treatment. His IPSS is 13 with nocturia x 2 and some urgency.    05/24/24: Eric Dominguez returns today in f/u for his history of T1c HG NMIBC with his last resection on 10/31/23. He had induction BCG last on 01/06/24. His UA is unremarkable. His IPSS is 9 with nocturia x 2. He will have some urgency after  sitting for a while. He has had no hematuria.      He was found to have some erythematous areas in the bladder with comedo changes on his cystoscopy on 9/15.  Subsequent cytology showed atypia with an abnormal FISH so a biopsy was felt to be indicated.    AUA Symptom Score: Less than 20% of the time he has the sensation of not emptying his bladder completely when finished urinating. Less than 20% of the time he has to urinate again fewer than two hours after he has finished urinating. Less than 50% of the time he has to start and stop again several times when he urinates. 50% of the time he finds it difficult to postpone urination. He never has a weak urinary stream. He never has to push or strain to begin urination. He has to get up to urinate 2 times from the time he goes to bed until the time he gets up in the morning.  ROS:  Review of Systems  All other systems reviewed and are negative.   Allergies  Allergen Reactions   Cedar Other (See Comments)   Penicillins Rash and Other (See Comments)    Fever    Past Medical History:  Diagnosis Date   Arthritis    First degree heart block    GERD (gastroesophageal reflux disease)    OCCASIONAL ZANTAC AT HS   Hepatitis 1975   no issues since   Hypertension    Primary localized osteoarthrosis of the knee, right  07/29/2017   Prostate cancer (HCC) 11/26/2012   gleason3+4=7,& 3+3=6,PSA=4.65 volume=42.5cc, Bladder   RBBB     Past Surgical History:  Procedure Laterality Date   CERVICAL DISC SURGERY     COLONOSCOPY  2016   CYSTOSCOPY N/A 01/14/2013   Procedure: CYSTOSCOPY FLEXIBLE;  Surgeon: Norleen JINNY Seltzer, MD;  Location: [facility];  Service: Urology;  Laterality: N/A;  no seeds found in bladder   CYSTOSCOPY W/ RETROGRADES Bilateral 06/18/2024   Procedure: CYSTOSCOPY, WITH RETROGRADE PYELOGRAM;  Surgeon: Seltzer Norleen, MD;  Location: [facility];  Service: Urology;  Laterality: Bilateral;   CYSTOSCOPY WITH BIOPSY N/A 09/26/2023    Procedure: CYSTOSCOPY WITH BIOPSY;  Surgeon: Seltzer Norleen, MD;  Location: [facility];  Service: Urology;  Laterality: N/A;  60 MINUTE CASE   CYSTOSCOPY WITH FULGERATION N/A 06/18/2024   Procedure: CYSTOSCOPY, WITH TURBT;  Surgeon: Seltzer Norleen, MD;  Location: [facility];  Service: Urology;  Laterality: N/A;   HARDWARE REMOVAL Right 08/11/2017   Procedure: HARDWARE REMOVAL RIGHT KNEE;  Surgeon: Jane Charleston, MD;  Location: [facility] OR;  Service: Orthopedics;  Laterality: Right;   LEFT ANKLE SURGERY  2012   RADIOACTIVE SEED IMPLANT N/A 01/14/2013   Procedure: RADIOACTIVE SEED IMPLANT;  Surgeon: Norleen JINNY Seltzer, MD;  Location: [facility];  Service: Urology;  Laterality: N/A;  68    seeds implanted   REPAIR TORN BICEP  Right 2012   TOTAL KNEE ARTHROPLASTY Right 08/11/2017   Procedure: RIGHT TOTAL KNEE ARTHROPLASTY;  Surgeon: Jane Charleston, MD;  Location: [facility];  Service: Orthopedics;  Laterality: Right;    Social History   Socioeconomic History   Marital status: Married    Spouse name: Not on file   Number of children: 5   Years of education: Not on file   Highest education level: Not on file  Occupational History   Occupation: retired [employer] professional   Tobacco Use   Smoking status: Never   Smokeless tobacco: Never  Vaping Use   Vaping status: Never Used  Substance and Sexual Activity   Alcohol use: Yes    Alcohol/week: 14.0 standard drinks of alcohol    Types: 14 Standard drinks or equivalent per week    Comment: 1-2 daily  ocasional   Drug use: No   Sexual activity: Not Currently  Other Topics Concern   Not on file  Social History Narrative   Not on file   Social Drivers of Health   Financial Resource Strain: Not on file  Food Insecurity: Not on file  Transportation Needs: Not on file  Physical Activity: Not on file  Stress: Not on file  Social Connections: Not on file  Intimate Partner Violence: Not on file    Family History  Problem Relation Age of Onset   Cancer  Father        Prostate 26 y ago/brachytherapy   Colon cancer Neg Hx    Esophageal cancer Neg Hx    Stomach cancer Neg Hx    Rectal cancer Neg Hx    Colon polyps Neg Hx     Anti-infectives: Anti-infectives (From admission, onward)    Start     Dose/Rate Route Frequency Ordered Stop   07/20/24 0544  ciprofloxacin  (CIPRO ) IVPB 400 mg        400 mg 200 mL/hr over 60 Minutes Intravenous 60 min pre-op 07/20/24 0544         Current Facility-Administered Medications  Medication Dose Route Frequency Provider Last Rate Last Admin  ciprofloxacin  (CIPRO ) IVPB 400 mg  400 mg Intravenous 60 min Pre-Op Dominguez Rush, MD       lactated ringers  infusion   Intravenous Continuous Erma Thom SAUNDERS, MD 10 mL/hr at 07/20/24 0611 New Bag at 07/20/24 9388     Objective: Vital signs in last 24 hours: BP 128/70   Pulse 65   Temp 97.7 F (36.5 C) (Oral)   Resp 17   Ht 6' (1.829 m)   Wt 94.3 kg   SpO2 96%   BMI 28.21 kg/m   Intake/Output from previous day: No intake/output data recorded. Intake/Output this shift: No intake/output data recorded.   Physical Exam Vitals reviewed.  Constitutional:      Appearance: Normal appearance.  Cardiovascular:     Rate and Rhythm: Normal rate and regular rhythm.  Pulmonary:     Effort: Pulmonary effort is normal. No respiratory distress.  Neurological:     Mental Status: He is alert.     Lab Results:  Results for orders placed or performed during the hospital encounter of 07/20/24 (from the past 24 hours)  Potassium     Status: None   Collection Time: 07/20/24  6:12 AM  Result Value Ref Range   Potassium 4.7 3.5 - 5.1 mmol/L    BMET Recent Labs    07/20/24 0612  K 4.7   PT/INR No results for input(s): LABPROT, INR in the last 72 hours. ABG No results for input(s): PHART, HCO3 in the last 72 hours.  Invalid input(s): PCO2, PO2  Studies/Results: No results found.   Assessment/Plan:  T1 HG NMIBC with early recurrence  following BCG induction.  He is for restaging TURBT today.  Risks have been reviewed in detail.             Eric Dominguez 07/20/2024

## 2024-07-20 NOTE — Transfer of Care (Signed)
 Immediate Anesthesia Transfer of Care Note  Patient: Eric Dominguez  Procedure(s) Performed: TURBT (TRANSURETHRAL RESECTION OF BLADDER TUMOR)  Patient Location: PACU  Anesthesia Type:General  Level of Consciousness: awake, alert , and oriented  Airway & Oxygen Therapy: Patient Spontanous Breathing and Patient connected to face mask oxygen  Post-op Assessment: Report given to RN and Post -op Vital signs reviewed and stable  Post vital signs: Reviewed and stable  Last Vitals:  Vitals Value Taken Time  BP 135/67 07/20/24 09:45  Temp    Pulse 56 07/20/24 09:46  Resp 17 07/20/24 09:46  SpO2 96 % 07/20/24 09:46  Vitals shown include unfiled device data.  Last Pain:  Vitals:   07/20/24 0605  TempSrc:   PainSc: 0-No pain         Complications: No notable events documented.

## 2024-07-20 NOTE — Anesthesia Procedure Notes (Signed)
 Procedure Name: Intubation Date/Time: 07/20/2024 8:59 AM  Performed by: Obadiah Reyes BROCKS, CRNAPre-anesthesia Checklist: Patient identified, Emergency Drugs available, Suction available and Patient being monitored Patient Re-evaluated:Patient Re-evaluated prior to induction Oxygen Delivery Method: Circle System Utilized Preoxygenation: Pre-oxygenation with 100% oxygen Induction Type: IV induction Ventilation: Mask ventilation without difficulty Laryngoscope Size: Miller and 2 Tube type: Oral Tube size: 7.5 mm Number of attempts: 1 Airway Equipment and Method: Stylet and Oral airway Placement Confirmation: ETT inserted through vocal cords under direct vision, positive ETCO2 and breath sounds checked- equal and bilateral Secured at: 23 cm Tube secured with: Tape Dental Injury: Teeth and Oropharynx as per pre-operative assessment

## 2024-07-20 NOTE — Discharge Instructions (Addendum)
You may remove the catheter in the morning.

## 2024-07-20 NOTE — Op Note (Signed)
 Procedure: 1.  Cystoscopy with bladder biopsy and fulguration. 2.  Transurethral resection of 3 cm bladder tumor of the dome and of the bladder neck/trigone.  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  Postop diagnosis: Same.  Surgeon: Dr. Norleen Seltzer.  Anesthesia: General.  Specimen: 1.  Bladder biopsy from the right lateral wall. 2.  Resection chips from the lesion on the dome. 3.  Resection chips from the lesion at the bladder neck/trigone.  Drains: 18 French Foley catheter.  EBL: None.  Complications: None.  Indications: The patient is a 78 year old male with a history of high-grade nonmuscle invasive bladder cancer that was T1 on initial presentation.  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  There was no muscle in the initial specimens so was felt restaging was indicated.  Procedure: He was taken the operating room where general anesthetic was induced.  He was intubated and paralyzed.  He was given Cipro  for antibiotic prophylaxis.  He was placed in lithotomy position and fitted with PAS hose.  He was prepped with Betadine  solution draped in usual sterile fashion.  Cystoscopy was performed using the 21 French scope and 30 degree lens.  Examination revealed a normal urethra.  The external sphincter was intact with mild membranous stricturing.  The prostatic urethra was widely patent, short and pale consistent with prior radiation therapy.  Examination of bladder revealed fibrinous exudate on the prior resection sites at the bladder neck in the midline on the right lateral wall and bladder neck and dome.  No papillary tumors were identified but there was some erythema with slightly velvety mucosa superior to the bladder neck resection lesion which could be persistent  carcinoma in situ or possibly just irritation from the adjacent fibrinous exudate.  Ureteral orifices were unremarkable.  A cup biopsy was obtained of the erythematous mucosa adjacent to the prior right treatment site and will be sent for a separate specimen.  The resectoscope sheath was then inserted with the aid of visual obturator and the 30 degree lens.  Visual operator was replaced and the [facility] handle with the 30 degree lens and bipolar loop.  Saline was used the irrigant.  Initially the biopsy site on the right lateral wall was fulgurated along with surrounding area approximately 2 cm in diameter.  Once hemostasis was achieved in this area I turned my attention to the lesion on the dome.  I scraped off the fibrinous exudate to expose the resection beds and evacuated and discarded this material.  I then resected the prior resection site down into muscle and included some of the parameter that had a bit of erythema but no papillary disease.  Once this area been adequately resected, the chips were evacuated and sent as a separate specimen.  I then turned my attention to the bladder neck where I similarly scraped off the fibrinous exudate and discarded that and then deeply resected the resection bed into muscle.  Care was taken to avoid ureteral orifices since this lesion did extend into the trigone.  These chips were evacuated and sent to pathology.  Final inspection was performed to complete hemostasis and ensure no retained tissue.  Once that was done the scope was removed and an 85 French Foley catheter was inserted.  The balloon was filled with 10 mL of sterile fluid.  He  was taken down from lithotomy position, his anesthetic was reversed and he was moved to recovery in stable condition.  There were no complications.

## 2024-07-21 ENCOUNTER — Encounter (HOSPITAL_COMMUNITY): Payer: Self-pay | Admitting: Urology

## 2024-07-21 LAB — SURGICAL PATHOLOGY

## 2024-08-02 ENCOUNTER — Other Ambulatory Visit (HOSPITAL_COMMUNITY): Payer: Self-pay | Admitting: Urology

## 2024-08-02 DIAGNOSIS — C679 Malignant neoplasm of bladder, unspecified: Secondary | ICD-10-CM

## 2024-08-04 NOTE — Progress Notes (Signed)
 Initial Genitourinary Oncology Clinic Note  Patient Name: Eric Dominguez Encounter Date: 08/12/2024  Referring Physician: Sander Donnice Loving, MD [address] [phone] [facility] [place],  [place] [address] Urologist:  Assessment/Plan:  Muscle Invasive Bladder Cancer ( [id number]) A 78 year male with MIBC presenting for further discussion on management.  A 78 year old male with a past medical history significant for prostate cancer (T1c Nx Mx Gleason 3+4 , PSA: 4.6) s/p brachytherapy (radioactive seed implant- 01/14/2013) with most recent PSA 0.21 ( 02/2024) with recent diagnosis of MIBC ( Muscle Invasive Bladder Cancer) 06/18/24 who presents for further discussion and management.  We reviewed his history, pathology, most recent images and discussed with patient that pathology is consistent with muscle-invasive bladder cancer.   We extensively reviewed the management of muscle-invasive bladder cancer including role for radical cystectomy with perioperative systemic therapy or trimodality therapy with maximal TURBT followed by chemoradiation. We explained that with prior brachytherapy he is not a good candidate for TMT.   We discussed the NIAGARA study, which is a phase 3, randomized, open-label trial that evaluated the efficacy and safety of perioperative durvalumab (an anti-PD-L1 antibody) in combination with neoadjuvant gemcitabine -cisplatin chemotherapy followed by radical cystectomy, compared to neoadjuvant gemcitabine -cisplatin and cystectomy alone, in cisplatin-eligible patients with muscle-invasive bladder cancer (MIBC). The trial demonstrated that adding durvalumab both before and after surgery significantly improved event-free survival and overall survival versus chemotherapy alone. At 24 months, event-free survival was 67.8% with durvalumab versus 59.8% with chemotherapy alone (hazard ratio [HR] 0.68, P<0.001), and overall survival was 82.2% versus 75.2% (HR 0.75, P=0.01).  https://www.nejm.org/doi/full/10.1056/NEJMoa2408154  We also discussed EV-P which was recently approved by FDA on July 30, 2024  for  MIBC based on the KEYNOTE-905/EV-303 study The KEYNOTE-905/EV-303 study is a phase 3 trial evaluating perioperative pembrolizumab (with or without enfortumab vedotin) versus radical cystectomy alone in cisplatin-ineligible or cisplatin-declining patients with muscle-invasive bladder cancer (MIBC). Overall, the study met its primary end point, showing that perioperative EV plus pembrolizumab reduced the risk of recurrence, progression, or death by 60% compared with surgery alone. The median [facility] was not reached (95% CI, 37.3 to NR) in the EV plus pembrolizumab arm vs 15.7 months (95% CI, 10.3 to 20.5) in the control arm (HR, 0.40; 95% CI, 0.28 to 0.57, one-sided P < .0001). The [facility] benefits were observed across subgroups in the study. Further, the perioperative regimen reduced the risk of death by 50% compared with surgery alone. The median OS was not reached (95% CI, NR to NR) in the EV plus pembrolizumab group vs 41.7 months (95% CI, 31.9 to NR) in the control group (HR, 0.50; 95% CI, 0.33 to 0.74, one-sided P = .0002).  If  neoadjuvant is not pursued prior to surgery. We also discussed the role of adjuvant immunotherapy based on the Checkmate 274 ( adjuvant nivolumab significantly improved DFS) and Ambassador trial (adjuvant Pembrolizumab significantly prolonged DFS)  Due to peripheral Neuropathy  and bilateral tinnitus we would not recommend cisplatin based chemotherapy and also explained our concern with use of EV. We discussed the potential for this to worsen his neuropathy which could impact his ability to play golf. We then discussed upfront cystectomy and based on pathology report we could consider incorporating circulating tumor DNA to help guide adjuvant therapy.  We discussed the recently reported IMvigor011 trial results with adjuvant atezolizumab  demonstrating an improvement in DFS and overall survival in patients with +ctDNA.   We discussed the potential side  effects of both EV and IO.  Plan - Needs complete staging, according to patient PET CT Scan is planned for Monday Aug 16, 2024 - CBC/CMP/PSA/ Testosterone - baseline ct DNA ordered - Likely plan for Upfront Cystectomy followed by decisions for adjuvant IO - Plan for consideration of referral to Genetics with family history at next visit   Taiwo Talabi MD MPH [facility] Hematology Oncology PGY-5 Fellow  TEACHING PHYSICIAN ATTESTATION  I was present with Dr. Lonzie, fellow during the history and exam.  I discussed the findings, assessment and plan with him and agree with the findings and plan as documented in his note.  Donnice Dooms, MD Attending Physician  History of Present Illness:  Eric Dominguez is a 78 y.o. male with who is seen in consultation at the request of Sander Donnice Loving, MD for an evaluation of MIBC  A 78 year old male with a past medical history significant for prostate cancer ( T1c Nx Mx Gleason 3+4 , PSA: 4.6 ) s/p brachytherapy (radioactive seed implant- 01/14/2013) with most recent PSA 0.21 ( 02/2024) with recent diagnosis of MIBC ( Muscle Invasive Bladder Cancer) who presents for further discussion and management.  Per chart review,   Presented with gross hematuria and urinary retention 09/24/23, subsequently had TURBT on 09/26/23 showing high grade urothelial carcinoma ( HGT1) and restaging TURBT done 10/31/23  due to muscularis propria not identified during TURBT. He completed BCG induction on 01/06/24. Cystoscopy done 05/24/24 showed :erythematous areas in the bladder with comedo changes. He subsequently had another Cystoscopy with TURBT on 06/18/24 with outside pathologist reporting recurrent High grade urothelial carcinoma. [facility] pathology review reports MIBC in the bladder dome  He presents today accompanied by his wife. He had previously worked  as a ship broker. He is married with 7 kids and 17 grandchildren.  Review of system was unremarkable. He denies weight loss/ loss of appetite/ chest pain/shortness of breath/ changes in bowel habits/changes in urinary habits/bone pain.  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   Oncology Summary  01/13/2013:Prostate Cancer: T1c Nx Mx Gleason 3+4 , PSA: 4.6 s/p brachytherapy (radioactive seed implant- 01/14/2013)  -09/24/23: presented to outside urology with Gross hematuria and inability to void   -09/26/23: Cystoscopy/TURBT: Bladder tumor of the neck left bladder neck, Nodular lesion resected. see procedure note of 09/26/23. Pathology with high-grade papillary urothelial carcinoma and invasion into lamina propria, No muscularis propria seen in pathology  - 10/31/2023: Cystoscopy/TURBT: restaging TURBT( 2 cm bladder tumor) with gemcitabine . Pathology: High grade urothelial carcinoma The carcinoma focally invades laminar propria.Muscularis propria (detrusor muscle) is present and not involved by carcinoma   -01/06/24: Induction BCG for NMIBC  -02/16/24: Cystoscopy  -05/24/24: Cystoscopy:erythematous areas in the bladder with comedo changes  -06/18/24: Pathology: Diagnosis  (Outside case: "[id number]", 10 slides, original collection date 06/18/2024)   A: Bladder, dome, biopsy - Invasive high grade urothelial carcinoma - The carcinoma invades muscularis propria.   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 - Benign Muscularis propria (detrusor muscle) is present.   C: Bladder, left bladder neck, biopsy - Invasive high grade urothelial carcinoma - Tumor invades lamina  propria - Muscularis propria (detrusor muscle) is present and not involved.   - 07/29/24: Seen by [facility]  -  08/12/24: Presents to [facility] oncology  Past Medical History: Bilateral Tinnitus/HTN (On lisinopril ) Erectile Dysfunction  ( on Tadalafil)  Past Surgical History: Right Total Knee arthroplasty 08/11/2017, C spine surgery ( 03/17/, 12/17  Medications: reviewed in EPIC  Allergies: reviewed in EPIC  Social History:Denies Smoking or occasionally drinks Alcohol  Family History:Father ( Prostate cancer at age  63), Sister ( Pancreatic Cancer)  Review of Systems: A 10 system review was completed and was negative except per HPI  Physical Exam: BSA: 2.24 meters squared BP 156/74   Pulse 52   Temp 36.3 C (97.3 F) (Oral)   Resp 16   Ht 182.9 cm (6')   Wt 98.6 kg (217 lb 4.8 oz)   SpO2 97%   BMI 29.47 kg/m  ECOG PS 1  General:   No acute distress, alert and interactive  Eyes:   Pupils equally round and reactive to light.  Extra ocular muscles intact.  Sclera not icteric.  ENT:  Oropharynx clear.  Neck:  Supple, no thyromegaly.  Lymph Nodes:  No adenopathy (cervical, axillary, inguinal)  Cardiovascular:  RRR without murmurs, rubs, gallops  Lungs:  Clear to auscultation bilaterally, without wheezes/crackles/rhonchi.  Skin:    No rash, lesions or breakdown   Abdomen:   Abdomen soft, not tender and not distended, no hepatosplenomegaly or masses.  Extremities:   Warm and well-perfused without edema.  Neurological:  Alert and oriented to person, place and time.   Labs:  08/12/24: PSA: 0.33 Testosterone: 505  Imaging:  Pathology:   09/26/23  FINAL MICROSCOPIC DIAGNOSIS:   A. BLADDER TUMOR, NECK, BIOPSY:       Invasive high-grade papillary urothelial carcinoma.       Carcinoma invades lamina propria.       No definitive muscularis propria (detrusor muscle) identified for  evaluation.      No carcinoma in situ identified.       No lymphovascular invasion  identified.     10/31/23  FINAL MICROSCOPIC DIAGNOSIS:   A. BLADDER NECK, LEFT LATERAL, TURBT:  High grade urothelial carcinoma  The carcinoma focally invades laminar propria  Muscularis propria (detrusor muscle) is present and not involved by  carcinoma    GROSS DESCRIPTION:   Specimen is received fresh and consists of a 1.7 x 0.8 x 0.4 cm  aggregate of tan-pink soft tissue.  Specimen is entirely submitted in 1  cassette.  (KL 10/31/2023)   [facility] review of outside pathology of 06/18/24  Diagnosis    (Outside case: [id number]", 10 slides, original collection date 06/18/2024)   A: Bladder, dome, biopsy - Invasive high grade urothelial carcinoma - The carcinoma invades muscularis propria.   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 - Benign Muscularis propria (detrusor muscle) is present.   C: Bladder, left bladder neck, biopsy - Invasive high grade urothelial carcinoma - Tumor invades lamina propria - Muscularis propria (detrusor muscle) is present and not involved.   This electronic signature is attestation that the pathologist personally reviewed the submitted material(s) and the final diagnosis reflects that evaluation.    Electronically signed by Truddie Fill, MD on 07/14/2024 at 1456 EST    Diagnosis Comment     Immunohistochemical testing was performed at the originating institution with appropriately staining controls and reviewed at [facility]. The tumor cells in parts A and C are positive for HMWCK and GATA-3 and  negative for NKX3.1.      Clinical History     Bladder cancer.    Gross Description     Received are 10 slides labeled "[phone]", with accompanying paperwork from [facility], Pathology Laboratory, [address] [place], [place], [place] [address], phone [phone], fax  [phone].  The slides are returned following review.     Microscopic Description     Microscopic examination substantiates the above diagnosis.    Disclaimer     Unless otherwise specified, specimens are preserved using 10% neutral buffered formalin. For cases in which immunohistochemical and/or in-situ hybridization stains are performed, the following statement applies: Appropriate controls for each stain (positive controls with or without negative controls) have been evaluated and stain as expected. These stains have not been separately validated for use on decalcified specimens and should be interpreted with caution in that setting. Some of the reagents used for these stains may be classified as analyte specific reagents (ASR). Tests using ASRs were developed, and their performance characteristics were determined, by the Anatomic Pathology Department [facility]). They have not been cleared or approved by the US  Food and Drug Administration (FDA). The FDA does not require these tests to go through premarket FDA review. These tests are used for clinical purposes. They should not be regarded as investigational or for research. This laboratory is certified under the Clinical Laboratory Improvement Amendments (CLIA) as qualified to perform high complexity clinical laboratory testing.      Resulting Agency [facility] <redacted file path>     06/18/24  FINAL MICROSCOPIC DIAGNOSIS:   A. BLADDER, DOME, BIOPSY:  Infiltrating high grade urothelial carcinoma  The carcinoma invades laminar propria  Muscularis propria (detrusor muscle) is not present   B. BLADDER, RIGHT NECK, BIOPSY:  Urothelial carcinoma in suit (CIS)   C. BLADDER, LEFT NECK, BIOPSY:  High grade urothelial carcinoma  The carcinoma invades laminar propria  Muscularis propria (detrusor muscle) is not present    Comment:  Part A and C: the neoplasm stains positive for high molecular weight  cytokeratin and  gata3, negative for nkx3.1. The morphology and staining  pattern supports these neoplastic cells are urothelial origin.   GROSS DESCRIPTION:   A: Specimen is received fresh and consists of a 0.3 cm piece of tan-pink  soft tissue.  The specimen is entirely submitted in 1 cassette.   B: The specimen is received fresh and consists of a 0.4 cm piece of  tan-pink soft tissue.  The specimen is entirely submitted in 1 cassette.   C: Specimen is received fresh and consists of 2 pieces of tan-pink soft  tissue, measuring 0.4 and 0.5 cm.  The specimen is entirely submitted in  1 cassette.  SHIRLEEN 06/18/2024)    07/20/24  FINAL MICROSCOPIC DIAGNOSIS:   A. BLADDER, RIGHT BASE, TURBT:  Urothelial mucosa with mostly denuded epithelium and benign Brunn's  nest.  Negative for in situ and invasive carcinoma.   B. BLADDER, DOME, TURBT:  Benign urothelial mucosa with attached inflamed submucosal tissue and  muscularis propria.  Negative for in situ and invasive carcinoma.   C. BLADDER, NECK, TURBT:  High-grade invasive urothelial carcinoma.  Inflamed and partially necrotic muscularis propria which is not involved  by carcinoma.   GROSS DESCRIPTION:  A. Received fresh and subsequently placed in formalin labeled with the  patient's name and R bladder base is a 0.3 cm piece of red-tan,  rubbery tissue. Submitted in toto in cassette A1.   B. Received  fresh and subsequently placed in formalin labeled with the  patient's name and Bladder dome is a 2.0 x 1.9 x 0.3 cm aggregate of  tan, friable soft tissue fragments. Submitted in toto in cassette B1.   C. Received fresh and subsequently placed in formalin labeled with the  patient's name and Bladder neck is a 2.2 x 2.0 x 0.3 cm aggregate of  tan, friable soft tissue fragments. Submitted in toto in cassette C1.   (LEF 07/20/2024)   Final Diagnosis performed by Norleen Dover, MD.   Electronically signed  07/21/2024  Technical component  performed at [facility], [address] [place], [place] [address].   Professional component performed at [employer]. [facility],  [address] [place], [place] [address].   Immunohistochemistry Technical component (if applicable) was performed  at [facility]. [address], [address],  [place], [place] [address].   IMMUNOHISTOCHEMISTRY DISCLAIMER (if applicable):  Some of these immunohistochemical stains may have been developed and the  performance characteristics determine by [facility]. Some  may not have been cleared or approved by the U.S. Food and Drug  Administration. The FDA has determined that such clearance or approval  is not necessary. This test is used for clinical purposes. It should not  be regarded as investigational or for research. This laboratory is  certified under the Clinical Laboratory Improvement Amendments of 1988  (CLIA-88) as qualified to perform high complexity clinical laboratory  testing.  The controls stained appropriately.   IHC stains are performed  on formalin fixed, paraffin embedded tissue using a 3,3diaminobenzidine  (DAB) chromogen and Leica Bond Autostainer System. The staining  intensity of the nucleus is score manually and is reported as the  percentage of tumor cell nuclei demonstrating specific nuclear staining.  The specimens are fixed in 10% Neutral Formalin for at least 6 hours and  up to 72hrs. These tests are validated on decalcified tissue. Results  should be interpreted with caution given the possibility of false  negative results on decalcified specimens. Antibody Clones are as  follows ER-clone 31F, PR-clone 16, Ki67- clone MM1. Some of these  immunohistochemical stains may have been developed and the performance  characteristics determined by [facility] Pathology.

## 2024-08-09 NOTE — Progress Notes (Signed)
 Assessment & Plan  Clinical stage T2 bladder cancer.  We had a long and thoughtful discussion concerning treatment options for high risk bladder cancer. The patient's history, pathology and imaging results were personally reviewed and discussed with the patient and his wife today, in the context of prognostic factors and risk stratification of bladder cancer. We explained that given the aggressiveness of his bladder cancer, we recommend accordingly aggressive treatment options.   In the case of surgical therapy with radical cystectomy, discussed the rationale for considering neoadjuvant cisplatin-based chemotherapy, where able. This has been demonstrated in prospective randomized studies as well as multiple observational series to be associated with survival advantage over surgical monotherapy.  At the discretion of our medical oncology colleagues, may also consider neoadjuvant gem/cis/durvalumab x 4 cycles, followed by 8 cycles of adjuvant durvalumab per NIAGRA or potentially EV / Pembro based on data recently presented at [facility].  This option is not open to all patients, primarily due to renal dysfunction or neuropathy, two major areas of toxicity associated with platinum-based chemotherapy.    We also discussed trimodality bladder-sparing treatment (TMT) including complete TURBT followed by chemotherapy and radiation, now a category I option on the NCCN guidelines. We discussed that outcomes of TMT are best in appropriately selected patients including those with small unifocal tumors without CIS, no hydronephrosis and good bladder function, who can tolerate one of the several radiosensitizing chemotherapy regimens. We discussed that we do not have randomized data available to compare [facility] to TMT but that comparative effectiveness research suggests cancer control outcomes may be comparable and the quality of life may be better with preservation of the native bladder, in appropriately selected patients.   Discussed that patients who undergo bladder preservation will require frequent cystoscopic surveillance to hedge against in-bladder recurrence; if muscle-invasive recurrence may end up needing a salvage cystectomy.   In his case, remote >10y ago history of brachytherapy for prostate cancer; this has traditionally been a contraindication to TMT but we would confer with our radiation oncology colleagues to see if any option for bladder sparing may be there.   These issues will be discussed further with Dr Claiborne later this week.  The two surgical approaches, robotic and open, were discussed in detail including decreased blood loss with robotic surgery and less operation time with an open procedure; we discussed the equipoise with regard to approach and that either would be a reasonable option.  We explained that, along with removal of the bladder, we also would remove pelvic lymph nodes and prostate.  In his case, the history of prostate radiation increases the risk of complicated / unavoidable rectal injury, with potential need for colostomy--low probability but greater than patients without prior radiation. The risks of surgery were discussed including risk of bleeding, the need for a blood transfusion, infection, injury to adjacent structures such as the rectum, major blood vessels, and  bowel, as well as anesthetic risk including, but not limited to heart attack, stroke, blood clots, and the rare case of death. We also discussed our typical hospital course and reasons for which he may require a longer stay. These include (but are not limited to) infection, blood clot, ileus, bowel obstruction, and any other complication associated with anesthesia and/or surgery. It was explained that the typical hospital stay is 6 days, which would include bowel rest with a slow advance of his diet to liquids and then solid foods. We also advised no heavy lifting, specifically no lifting greater than 10 pounds for the  first 6 weeks  after surgery. The patient was advised that he would have a JP drain in place after surgery, and that this drain is typically removed prior to discharge. We also told him that he would have two ureteral stents in place, typically until a post-op evaluation approximately 2 weeks after discharge.   We then discussed the two most common types of urinary diversions, specifically orthotopic neobladder and ileal conduit. We explained that with the neobladder, a large piece of small bowel is taken and constructed into a reservoir, which is then connected to his ureters and urethra. Neobladder less favorable for patients with history of pelvic radiation.  We then went on to discuss ileal conduit urinary diversion. We explained that this requires taking a shorter piece of small bowel and constructing a conduit which is connected to his ureters. We explained that a small opening in the skin would be made to  where urine would drain into a bag. We advised that complications with this type of procedure can include hernia around the ostomy site. We also explained that with either procedure, strictures can develop at the anastomoses.   At this time, will await PET scan results and consultation with Drs Claiborne and [facility] later this week, discuss his case in Tumor Board and follow up with him pending this discussion to coordinate care according to his preferences. It was a pleasure seeing this pleasant patient in clinic, and we look forward to following his progress.   We reviewed the printed materials from the [facility]), gave them a copy of the booklet, and referred them to the website ([email or web address]), where additional resources, including contact information for the national network of peer mentor patients and caregivers, which has been very helpful to many of our patients.  [facility] has a Survivor 2 Survivor program in which patients are matched with other bladder cancer patients of similar age,  gender, and stage. To initiate the program, the patient must call [phone]), extension [phone]. They can leave a message on the [employer], and they will get a call back the same day or next business day to obtain information needed to make the appropriate match.  Reason for Visit:  The patient is seen in follow-up for further discussion of treatment options for T2 bladder cancer.  History of Present Illness  The patient is a pleasant gentleman seen at the request of Dr Watt.  Remote history of prostate cancer s/p brachytherapy 2014 (PSA 0.21 02/2024). He presented 09/2023 with gross hematuria and retention (PVR 547).  Ultimately had TURBT performed 09/26/23.  HGT1.  Restaging resection 10/31/23.  Completed BCG induction 12/2023.  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 - Benign Muscularis propria (detrusor muscle) is present.   C: Bladder, left bladder neck, biopsy - Invasive high grade urothelial carcinoma - Tumor invades lamina propria - Muscularis propria (detrusor muscle) is present and not involved.  Last imaging CT a/p w / wo contrast 09/19/23--no radiographic evidence of metastatic disease.   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  This was a lot for them to digest at that point; they wished to have this follow-up to discuss in more detail.  Dr Watt and team are coordinating PET scan locally, and we have him tentatively scheduled for consultation with Drs Fern and Claiborne this week from medical oncology and radiation oncology, respectively.   PAST MEDICAL HISTORY: Past Medical History[1]  PAST SURGICAL HISTORY: Past Surgical History[2]  SOCIAL HISTORY: Social History [3]  FAMILY HISTORY: family history is not on file.  MEDICATIONS:  Prior to Admission medications  Not on File    ALLERGIES: Allergies[4]  REVIEW OF SYSTEMS: otherwise negative upon 12-system review other than what is mentioned in the HPI.   PHYSICAL EXAM GENERAL: Pleasant male in no acute distress.  VITAL SIGNS: There were no vitals taken for this visit. HEENT: Normocephalic, atraumatic, extraocular muscles intact NECK: Supple, no lymphadenopathy CARDIOVASCULAR: No peripheral edema PULMONARY: Normal work of breathing, no use of accessory muscles ABDOMEN: Soft, non-tender, non-distended. No organomegaly or hernias. BACK: No costovertebral angle tenderness, no spiny bone tenderness.  EXTREMITIES: No clubbing, cyanosis or edema.  NEUROLOGIC:  Cranial nerves II-XII grossly intact PSYCHOLOGIC: Normal affect, normal mood SKIN: Warm and dry. No lesions.  I personally spent >40 minutes face-to-face and non-face-to-face in the care of this patient, which includes all pre, intra, and post visit time on the date of service.        [1] No past medical history on file. [2] No past surgical history on file. [3] Social History Socioeconomic History  . Marital status: Married  Tobacco Use  . Smoking status: Never  . Smokeless tobacco: Never   Social Drivers of Health   Food Insecurity: No Food Insecurity (08/03/2024)    Hunger Vital Sign   . Worried About Programme Researcher, Broadcasting/film/video in the Last Year: Never true   . Ran Out of Food in the Last Year: Never true  Tobacco Use: Low Risk (08/03/2024)   Patient History   . Smoking Tobacco Use: Never   . Smokeless Tobacco Use: Never  Transportation Needs: No Transportation Needs (08/03/2024)   PRAPARE - Transportation   . Lack of Transportation (Medical): No   . Lack of Transportation (Non-Medical): No  Housing: Low Risk (08/03/2024)   Housing   . Within the past 12 months, have you ever stayed: outside, in a car, in a tent, in an overnight shelter, or temporarily in someone else's home (i.e. couch-surfing)?: No   . Are you worried about losing your housing?: No  Utilities: Low Risk (08/03/2024)   Utilities   . Within the past 12 months, have you been unable to get utilities (heat, electricity) when it was really needed?: No  Financial Resource Strain: Low Risk (08/03/2024)   Overall Financial Resource Strain (CARDIA)   . Difficulty of Paying Living Expenses: Not hard at all  Health Literacy: Low Risk (08/03/2024)   Health Literacy   . : Never  Internet Connectivity: No Internet connectivity concern identified (08/03/2024)   Internet Connectivity   . Do you have access to internet services: Yes   . How do you connect to the internet: Personal Device at home   . Is your internet connection strong enough for you  to watch video on your device without major problems?: Yes   . Do you have enough data to get through the month?: Yes   . Does at least one of the devices have a camera that you can use for video chat?: Yes  [4] Not on File

## 2024-08-16 ENCOUNTER — Ambulatory Visit
Admission: RE | Admit: 2024-08-16 | Discharge: 2024-08-16 | Disposition: A | Source: Ambulatory Visit | Attending: Urology | Admitting: Urology

## 2024-08-16 DIAGNOSIS — C679 Malignant neoplasm of bladder, unspecified: Secondary | ICD-10-CM

## 2024-08-16 LAB — GLUCOSE, CAPILLARY: Glucose-Capillary: 116 mg/dL — ABNORMAL HIGH (ref 70–99)

## 2024-08-16 MED ORDER — FLUDEOXYGLUCOSE F - 18 (FDG) INJECTION
10.8000 | Freq: Once | INTRAVENOUS | Status: AC | PRN
  Administered 2024-08-16: 11.38 via INTRAVENOUS

## 2024-09-06 ENCOUNTER — Encounter (HOSPITAL_COMMUNITY): Payer: Self-pay
